# Patient Record
Sex: Female | Born: 1935 | Race: White | Hispanic: No | Marital: Married | Smoking: Never smoker
Health system: Southern US, Community
[De-identification: ages and names within clinical notes are randomized; demographics above are authoritative.]

## PROBLEM LIST (undated history)

## (undated) DIAGNOSIS — F0390 Unspecified dementia without behavioral disturbance: Secondary | ICD-10-CM

## (undated) DIAGNOSIS — I10 Essential (primary) hypertension: Secondary | ICD-10-CM

## (undated) HISTORY — PX: CARDIAC SURGERY: SHX584

## (undated) HISTORY — PX: COLON SURGERY: SHX602

---

## 2015-06-06 ENCOUNTER — Emergency Department (HOSPITAL_COMMUNITY)
Admission: EM | Admit: 2015-06-06 | Discharge: 2015-06-06 | Disposition: A | Discharge status: Home / Self Care | Payer: Medicare Other | Attending: Emergency Medicine | Admitting: Emergency Medicine

## 2015-06-06 ENCOUNTER — Emergency Department (HOSPITAL_COMMUNITY)
Admit: 2015-06-06 | Discharge: 2015-06-06 | Disposition: A | Discharge status: Home / Self Care | Payer: Medicare Other | Attending: Emergency Medicine | Admitting: Emergency Medicine

## 2015-06-06 ENCOUNTER — Emergency Department (HOSPITAL_COMMUNITY): Payer: Medicare Other

## 2015-06-06 ENCOUNTER — Encounter (HOSPITAL_COMMUNITY): Payer: Self-pay | Admitting: *Deleted

## 2015-06-06 DIAGNOSIS — R0789 Other chest pain: Secondary | ICD-10-CM | POA: Diagnosis not present

## 2015-06-06 DIAGNOSIS — F0390 Unspecified dementia without behavioral disturbance: Secondary | ICD-10-CM

## 2015-06-06 DIAGNOSIS — Z79899 Other long term (current) drug therapy: Secondary | ICD-10-CM | POA: Diagnosis not present

## 2015-06-06 DIAGNOSIS — R296 Repeated falls: Secondary | ICD-10-CM | POA: Diagnosis not present

## 2015-06-06 DIAGNOSIS — I10 Essential (primary) hypertension: Secondary | ICD-10-CM | POA: Diagnosis not present

## 2015-06-06 DIAGNOSIS — R2243 Localized swelling, mass and lump, lower limb, bilateral: Secondary | ICD-10-CM | POA: Diagnosis not present

## 2015-06-06 DIAGNOSIS — R0602 Shortness of breath: Secondary | ICD-10-CM | POA: Diagnosis present

## 2015-06-06 DIAGNOSIS — M7989 Other specified soft tissue disorders: Secondary | ICD-10-CM | POA: Diagnosis not present

## 2015-06-06 HISTORY — DX: Essential (primary) hypertension: I10

## 2015-06-06 HISTORY — DX: Unspecified dementia, unspecified severity, without behavioral disturbance, psychotic disturbance, mood disturbance, and anxiety: F03.90

## 2015-06-06 LAB — BRAIN NATRIURETIC PEPTIDE: B NATRIURETIC PEPTIDE 5: 133.9 pg/mL — AB (ref 0.0–100.0)

## 2015-06-06 LAB — CBC
HCT: 39.8 % (ref 36.0–46.0)
Hemoglobin: 13.1 g/dL (ref 12.0–15.0)
MCH: 30.2 pg (ref 26.0–34.0)
MCHC: 32.9 g/dL (ref 30.0–36.0)
MCV: 91.7 fL (ref 78.0–100.0)
PLATELETS: 275 10*3/uL (ref 150–400)
RBC: 4.34 MIL/uL (ref 3.87–5.11)
RDW: 14.7 % (ref 11.5–15.5)
WBC: 5.9 10*3/uL (ref 4.0–10.5)

## 2015-06-06 LAB — BASIC METABOLIC PANEL
ANION GAP: 8 (ref 5–15)
BUN: 18 mg/dL (ref 6–20)
CO2: 23 mmol/L (ref 22–32)
Calcium: 8.7 mg/dL — ABNORMAL LOW (ref 8.9–10.3)
Chloride: 108 mmol/L (ref 101–111)
Creatinine, Ser: 0.85 mg/dL (ref 0.44–1.00)
GFR calc Af Amer: >60 mL/min (ref >60)
GFR, EST NON AFRICAN AMERICAN: 60 mL/min — AB (ref >60)
GLUCOSE: 92 mg/dL (ref 65–99)
POTASSIUM: 3.5 mmol/L (ref 3.5–5.1)
SODIUM: 139 mmol/L (ref 135–145)

## 2015-06-06 LAB — I-STAT TROPONIN, ED
TROPONIN I, POC: 0 ng/mL (ref 0.00–0.08)
Troponin i, poc: 0.01 ng/mL (ref 0.00–0.08)

## 2015-06-06 MED ORDER — ALPRAZOLAM 0.25 MG PO TABS: 0.2500 mg | ORAL_TABLET | Freq: Three times a day (TID) | ORAL | Status: DC | PRN
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
## 2015-06-06 NOTE — ED Notes (Signed)
Code 2837
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
## 2015-06-06 NOTE — Discharge Instructions (Signed)
Dementia Dementia is a general term for problems with brain function. A person with dementia has memory loss and a hard time with at least one other brain function such as thinking, speaking, or problem solving. Dementia can affect social functioning, how you do your job, your mood, or your personality. The changes may be hidden for a long time. The earliest forms of this disease are usually not detected by family or friends. Dementia can be:  Irreversible.  Potentially reversible.  Partially reversible.  Progressive. This means it can get worse over time. CAUSES  Irreversible dementia causes may include:  Degeneration of brain cells (Alzheimer disease or Lewy body dementia).  Multiple small strokes (vascular dementia).  Infection (chronic meningitis or Creutzfeldt-Jakob disease).  Frontotemporal dementia. This affects younger people, age 40 to 70, compared to those who have Alzheimer disease.  Dementia associated with other disorders like Parkinson disease, Huntington disease, or HIV-associated dementia. Potentially or partially reversible dementia causes may include:  Medicines.  Metabolic causes such as excessive alcohol intake, vitamin B12 deficiency, or thyroid disease.  Masses or pressure in the brain such as a tumor, blood clot, or hydrocephalus. SIGNS AND SYMPTOMS  Symptoms are often hard to detect. Family members or coworkers may not notice them early in the disease process. Different people with dementia may have different symptoms. Symptoms can include:  A hard time with memory, especially recent memory. Long-term memory may not be impaired.  Asking the same question multiple times or forgetting something someone just said.  A hard time speaking your thoughts or finding certain words.  A hard time solving problems or performing familiar tasks (such as how to use a telephone).  Sudden changes in mood.  Changes in personality, especially increasing moodiness or  mistrust.  Depression.  A hard time understanding complex ideas that were never a problem in the past. DIAGNOSIS  There are no specific tests for dementia.   Your health care provider may recommend a thorough evaluation. This is because some forms of dementia can be reversible. The evaluation will likely include a physical exam and getting a detailed history from you and a family member. The history often gives the best clues and suggestions for a diagnosis.  Memory testing may be done. A detailed brain function evaluation called neuropsychologic testing may be helpful.  Lab tests and brain imaging (such as a CT scan or MRI scan) are sometimes important.  Sometimes observation and re-evaluation over time is very helpful. TREATMENT  Treatment depends on the cause.   If the problem is a vitamin deficiency, it may be helped or cured with supplements.  For dementias such as Alzheimer disease, medicines are available to stabilize or slow the course of the disease. There are no cures for this type of dementia.  Your health care provider can help direct you to groups, organizations, and other health care providers to help with decisions in the care of you or your loved one. HOME CARE INSTRUCTIONS The care of individuals with dementia is varied and dependent upon the progression of the dementia. The following suggestions are intended for the person living with, or caring for, the person with dementia.  Create a safe environment.  Remove the locks on bathroom doors to prevent the person from accidentally locking himself or herself in.  Use childproof latches on kitchen cabinets and any place where cleaning supplies, chemicals, or alcohol are kept.  Use childproof covers in unused electrical outlets.  Install childproof devices to keep doors and windows   secured.  Remove stove knobs or install safety knobs and an automatic shut-off on the stove.  Lower the temperature on water  heaters.  Label medicines and keep them locked up.  Secure knives, lighters, matches, power tools, and guns, and keep these items out of reach.  Keep the house free from clutter. Remove rugs or anything that might contribute to a fall.  Remove objects that might break and hurt the person.  Make sure lighting is good, both inside and outside.  Install grab rails as needed.  Use a monitoring device to alert you to falls or other needs for help.  Reduce confusion.  Keep familiar objects and people around.  Use night lights or dim lights at night.  Label items or areas.  Use reminders, notes, or directions for daily activities or tasks.  Keep a simple, consistent routine for waking, meals, bathing, dressing, and bedtime.  Create a calm, quiet environment.  Place large clocks and calendars prominently.  Display emergency numbers and home address near all telephones.  Use cues to establish different times of the day. An example is to open curtains to let the natural light in during the day.   Use effective communication.  Choose simple words and short sentences.  Use a gentle, calm tone of voice.  Be careful not to interrupt.  If the person is struggling to find a word or communicate a thought, try to provide the word or thought.  Ask one question at a time. Allow the person ample time to answer questions. Repeat the question again if the person does not respond.  Reduce nighttime restlessness.  Provide a comfortable bed.  Have a consistent nighttime routine.  Ensure a regular walking or physical activity schedule. Involve the person in daily activities as much as possible.  Limit napping during the day.  Limit caffeine.  Attend social events that stimulate rather than overwhelm the senses.  Encourage good nutrition and hydration.  Reduce distractions during meal times and snacks.  Avoid foods that are too hot or too cold.  Monitor chewing and swallowing  ability.  Continue with routine vision, hearing, dental, and medical screenings.  Give medicines only as directed by the health care provider.  Monitor driving abilities. Do not allow the person to drive when safe driving is no longer possible.  Register with an identification program which could provide location assistance in the event of a missing person situation. SEEK MEDICAL CARE IF:   New behavioral problems start such as moodiness, aggressiveness, or seeing things that are not there (hallucinations).  Any new problem with brain function happens. This includes problems with balance, speech, or falling a lot.  Problems with swallowing develop.  Any symptoms of other illness happen. Small changes or worsening in any aspect of brain function can be a sign that the illness is getting worse. It can also be a sign of another medical illness such as infection. Seeing a health care provider right away is important. SEEK IMMEDIATE MEDICAL CARE IF:   A fever develops.  New or worsened confusion develops.  New or worsened sleepiness develops.  Staying awake becomes hard to do. Document Released: 05/28/2001 Document Revised: 04/18/2014 Document Reviewed: 04/29/2011 [facility] Patient Information 2015 [place], [place]. This information is not intended to replace advice given to you by your health care provider. Make sure you discuss any questions you have with your health care provider.    Chest Pain (Nonspecific) It is often hard to give a diagnosis for the  cause of chest pain. There is always a chance that your pain could be related to something serious, such as a heart attack or a blood clot in the lungs. You need to follow up with your doctor. HOME CARE  If antibiotic medicine was given, take it as directed by your doctor. Finish the medicine even if you start to feel better.  For the next few days, avoid activities that bring on chest pain. Continue physical activities as told by  your doctor.  Do not use any tobacco products. This includes cigarettes, chewing tobacco, and e-cigarettes.  Avoid drinking alcohol.  Only take medicine as told by your doctor.  Follow your doctor's suggestions for more testing if your chest pain does not go away.  Keep all doctor visits you made. GET HELP IF:  Your chest pain does not go away, even after treatment.  You have a rash with blisters on your chest.  You have a fever. GET HELP RIGHT AWAY IF:   You have more pain or pain that spreads to your arm, neck, jaw, back, or belly (abdomen).  You have shortness of breath.  You cough more than usual or cough up blood.  You have very bad back or belly pain.  You feel sick to your stomach (nauseous) or throw up (vomit).  You have very bad weakness.  You pass out (faint).  You have chills. This is an emergency. Do not wait to see if the problems will go away. Call your local emergency services (911 in [place] Do not drive yourself to the hospital. MAKE SURE YOU:   Understand these instructions.  Will watch your condition.  Will get help right away if you are not doing well or get worse. Document Released: 05/20/2008 Document Revised: 12/07/2013 Document Reviewed: 05/20/2008 [facility] Patient Information 2015 [place], [place]. This information is not intended to replace advice given to you by your health care provider. Make sure you discuss any questions you have with your health care provider.

## 2015-06-06 NOTE — ED Notes (Signed)
PT at the bedside to evaluate.

## 2015-06-06 NOTE — ED Notes (Signed)
Home health at bedside.
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
## 2015-06-06 NOTE — ED Notes (Signed)
Case worker at bedside.

## 2015-06-06 NOTE — Progress Notes (Signed)
VASCULAR LAB PRELIMINARY  PRELIMINARY  PRELIMINARY  PRELIMINARY  Left lower extremity venous duplex completed.    Preliminary report:  Left:  No evidence of DVT, superficial thrombosis, or Baker's cyst.  Ann-Marie Kluge, RVT 06/06/2015, 11:19 AM
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
## 2015-06-06 NOTE — ED Notes (Signed)
Dahlia Client Child psychotherapist at bedside with pt and family

## 2015-06-06 NOTE — ED Notes (Signed)
Pt has dementia, family reports pt having increase in agitation, leg swelling and cp/sob.

## 2015-06-09 ENCOUNTER — Emergency Department (HOSPITAL_COMMUNITY): Payer: Medicare Other

## 2015-06-09 ENCOUNTER — Emergency Department (HOSPITAL_COMMUNITY)
Admission: EM | Admit: 2015-06-09 | Discharge: 2015-06-09 | Disposition: A | Discharge status: Home / Self Care | Payer: Medicare Other | Attending: Emergency Medicine | Admitting: Emergency Medicine

## 2015-06-09 ENCOUNTER — Encounter (HOSPITAL_COMMUNITY): Payer: Self-pay | Admitting: *Deleted

## 2015-06-09 DIAGNOSIS — Z79899 Other long term (current) drug therapy: Secondary | ICD-10-CM | POA: Diagnosis not present

## 2015-06-09 DIAGNOSIS — Y998 Other external cause status: Secondary | ICD-10-CM | POA: Diagnosis not present

## 2015-06-09 DIAGNOSIS — W1839XA Other fall on same level, initial encounter: Secondary | ICD-10-CM | POA: Insufficient documentation

## 2015-06-09 DIAGNOSIS — I10 Essential (primary) hypertension: Secondary | ICD-10-CM | POA: Diagnosis not present

## 2015-06-09 DIAGNOSIS — W19XXXA Unspecified fall, initial encounter: Secondary | ICD-10-CM

## 2015-06-09 DIAGNOSIS — Z008 Encounter for other general examination: Secondary | ICD-10-CM | POA: Diagnosis present

## 2015-06-09 DIAGNOSIS — Z043 Encounter for examination and observation following other accident: Secondary | ICD-10-CM | POA: Diagnosis not present

## 2015-06-09 DIAGNOSIS — Y929 Unspecified place or not applicable: Secondary | ICD-10-CM | POA: Diagnosis not present

## 2015-06-09 DIAGNOSIS — Y939 Activity, unspecified: Secondary | ICD-10-CM | POA: Diagnosis not present

## 2015-06-09 DIAGNOSIS — F0390 Unspecified dementia without behavioral disturbance: Secondary | ICD-10-CM | POA: Insufficient documentation

## 2015-06-09 DIAGNOSIS — F419 Anxiety disorder, unspecified: Secondary | ICD-10-CM | POA: Insufficient documentation

## 2015-06-09 LAB — COMPREHENSIVE METABOLIC PANEL
ALT: 15 U/L (ref 14–54)
ANION GAP: 8 (ref 5–15)
AST: 19 U/L (ref 15–41)
Albumin: 3.4 g/dL — ABNORMAL LOW (ref 3.5–5.0)
Alkaline Phosphatase: 106 U/L (ref 38–126)
BILIRUBIN TOTAL: 0.7 mg/dL (ref 0.3–1.2)
BUN: 16 mg/dL (ref 6–20)
CALCIUM: 9 mg/dL (ref 8.9–10.3)
CHLORIDE: 106 mmol/L (ref 101–111)
CO2: 25 mmol/L (ref 22–32)
Creatinine, Ser: 0.75 mg/dL (ref 0.44–1.00)
GFR calc non Af Amer: >60 mL/min (ref >60)
GLUCOSE: 96 mg/dL (ref 65–99)
Potassium: 3.4 mmol/L — ABNORMAL LOW (ref 3.5–5.1)
Sodium: 139 mmol/L (ref 135–145)
Total Protein: 6.9 g/dL (ref 6.5–8.1)

## 2015-06-09 LAB — URINALYSIS, ROUTINE W REFLEX MICROSCOPIC
BILIRUBIN URINE: NEGATIVE
GLUCOSE, UA: NEGATIVE mg/dL
Hgb urine dipstick: NEGATIVE
KETONES UR: NEGATIVE mg/dL
LEUKOCYTES UA: NEGATIVE
Nitrite: NEGATIVE
PROTEIN: NEGATIVE mg/dL
Specific Gravity, Urine: 1.01 (ref 1.005–1.030)
Urobilinogen, UA: 0.2 mg/dL (ref 0.0–1.0)
pH: 5.5 (ref 5.0–8.0)

## 2015-06-09 LAB — CBC WITH DIFFERENTIAL/PLATELET
BASOS PCT: 1 % (ref 0–1)
Basophils Absolute: 0 10*3/uL (ref 0.0–0.1)
EOS ABS: 0.1 10*3/uL (ref 0.0–0.7)
Eosinophils Relative: 1 % (ref 0–5)
HCT: 39.3 % (ref 36.0–46.0)
Hemoglobin: 13.1 g/dL (ref 12.0–15.0)
Lymphocytes Relative: 34 % (ref 12–46)
Lymphs Abs: 2.1 10*3/uL (ref 0.7–4.0)
MCH: 30.3 pg (ref 26.0–34.0)
MCHC: 33.3 g/dL (ref 30.0–36.0)
MCV: 90.8 fL (ref 78.0–100.0)
MONO ABS: 0.4 10*3/uL (ref 0.1–1.0)
Monocytes Relative: 7 % (ref 3–12)
NEUTROS PCT: 57 % (ref 43–77)
Neutro Abs: 3.6 10*3/uL (ref 1.7–7.7)
Platelets: 311 10*3/uL (ref 150–400)
RBC: 4.33 MIL/uL (ref 3.87–5.11)
RDW: 14.8 % (ref 11.5–15.5)
WBC: 6.2 10*3/uL (ref 4.0–10.5)

## 2015-06-09 LAB — CBG MONITORING, ED: Glucose-Capillary: 107 mg/dL — ABNORMAL HIGH (ref 65–99)

## 2015-06-09 NOTE — ED Notes (Signed)
Pt was sent here from her PCP for a psych evaluation. Son and friend at bedside her dementia has progressively worsen. Pt lives with older brother who is unable to care for the patient. Son reports auditory hallucinations. Pt has recently been wondering away from her house. Pt denies pain or any other symptoms.

## 2015-06-09 NOTE — ED Notes (Signed)
Pt left with family before being discharged, family asked to wait with patient and they stated they did not want to

## 2015-06-09 NOTE — ED Notes (Signed)
Son reports recently fall a couple of days ago.
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
## 2016-08-29 IMAGING — CR DG CHEST 2V
2 series · 2 of 2 positions shown · non-contrast
Comparison: 09/06/2009

CLINICAL DATA: Short of breath and leg swelling

EXAM:
CHEST  2 VIEW

[chest lat]
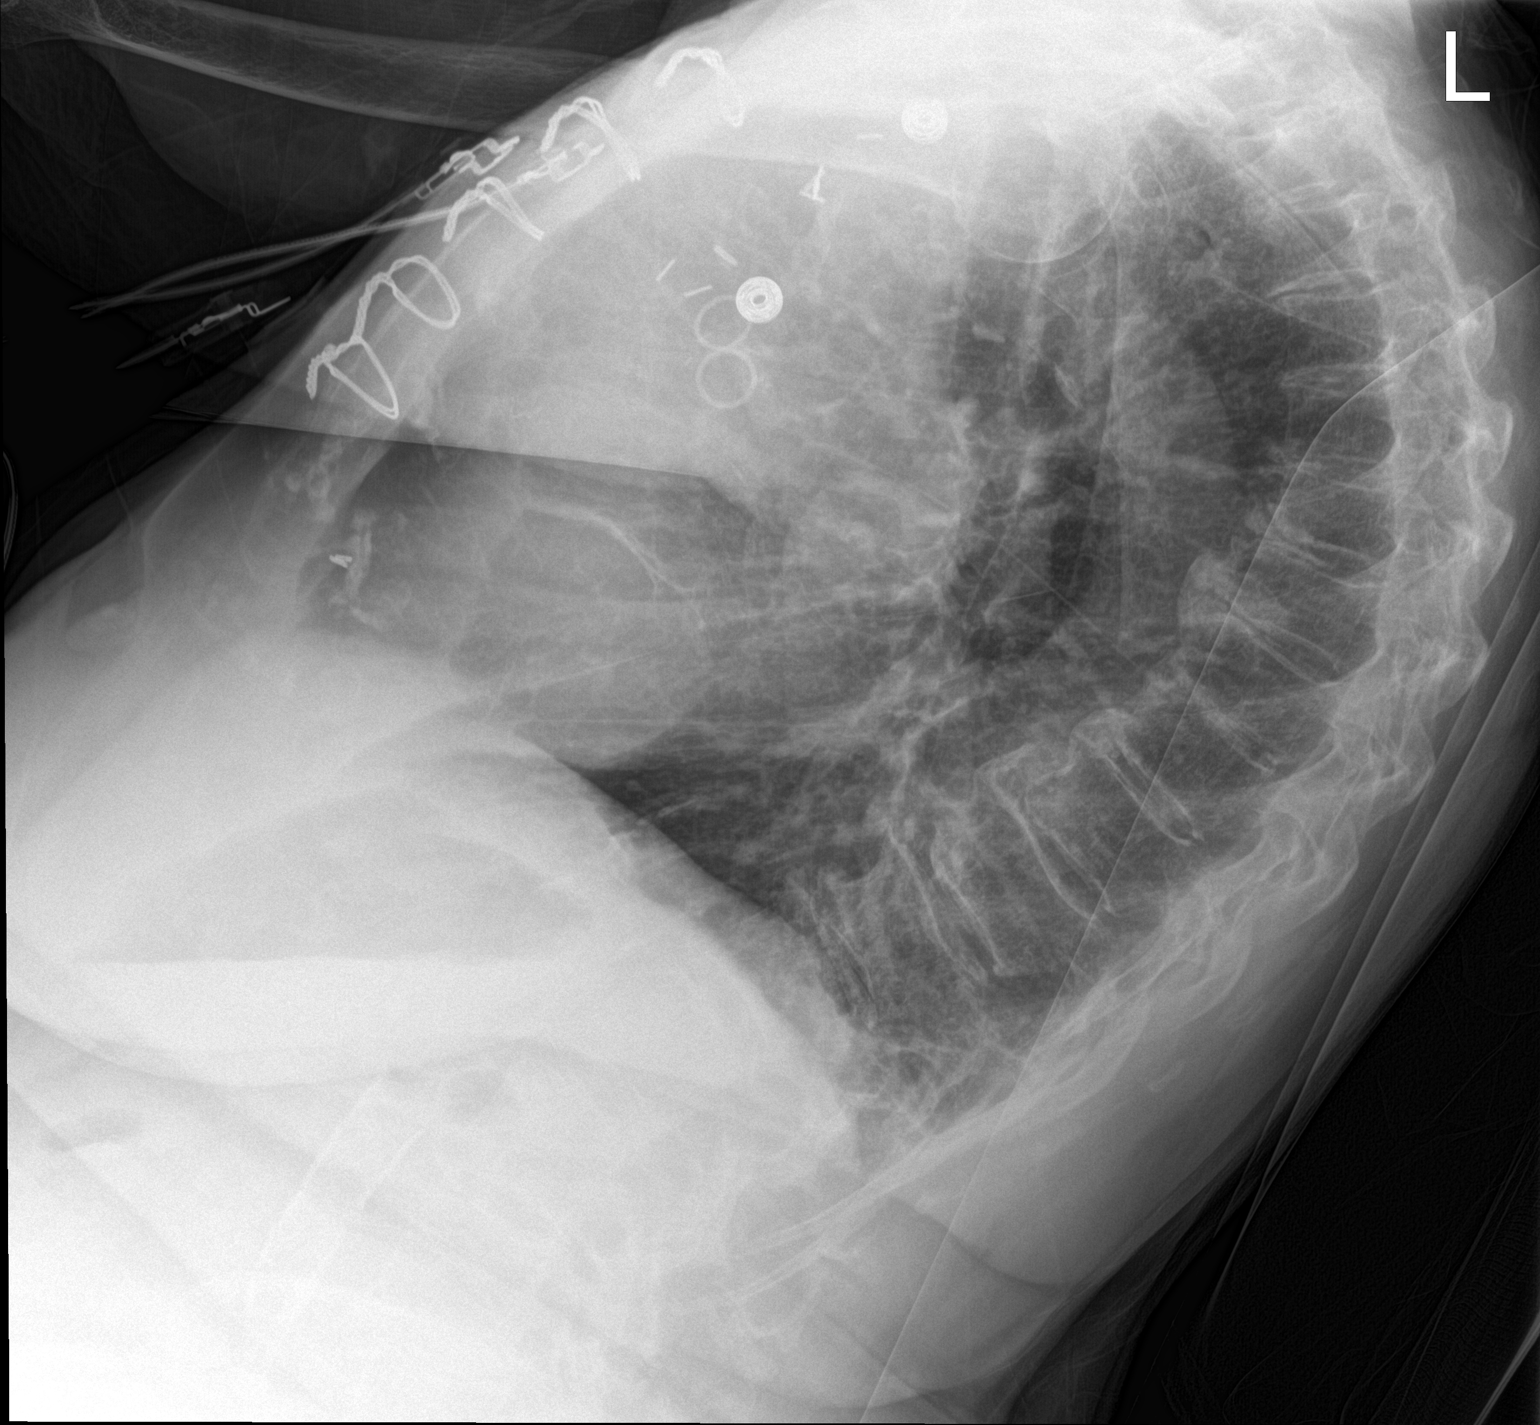

[chest ap]
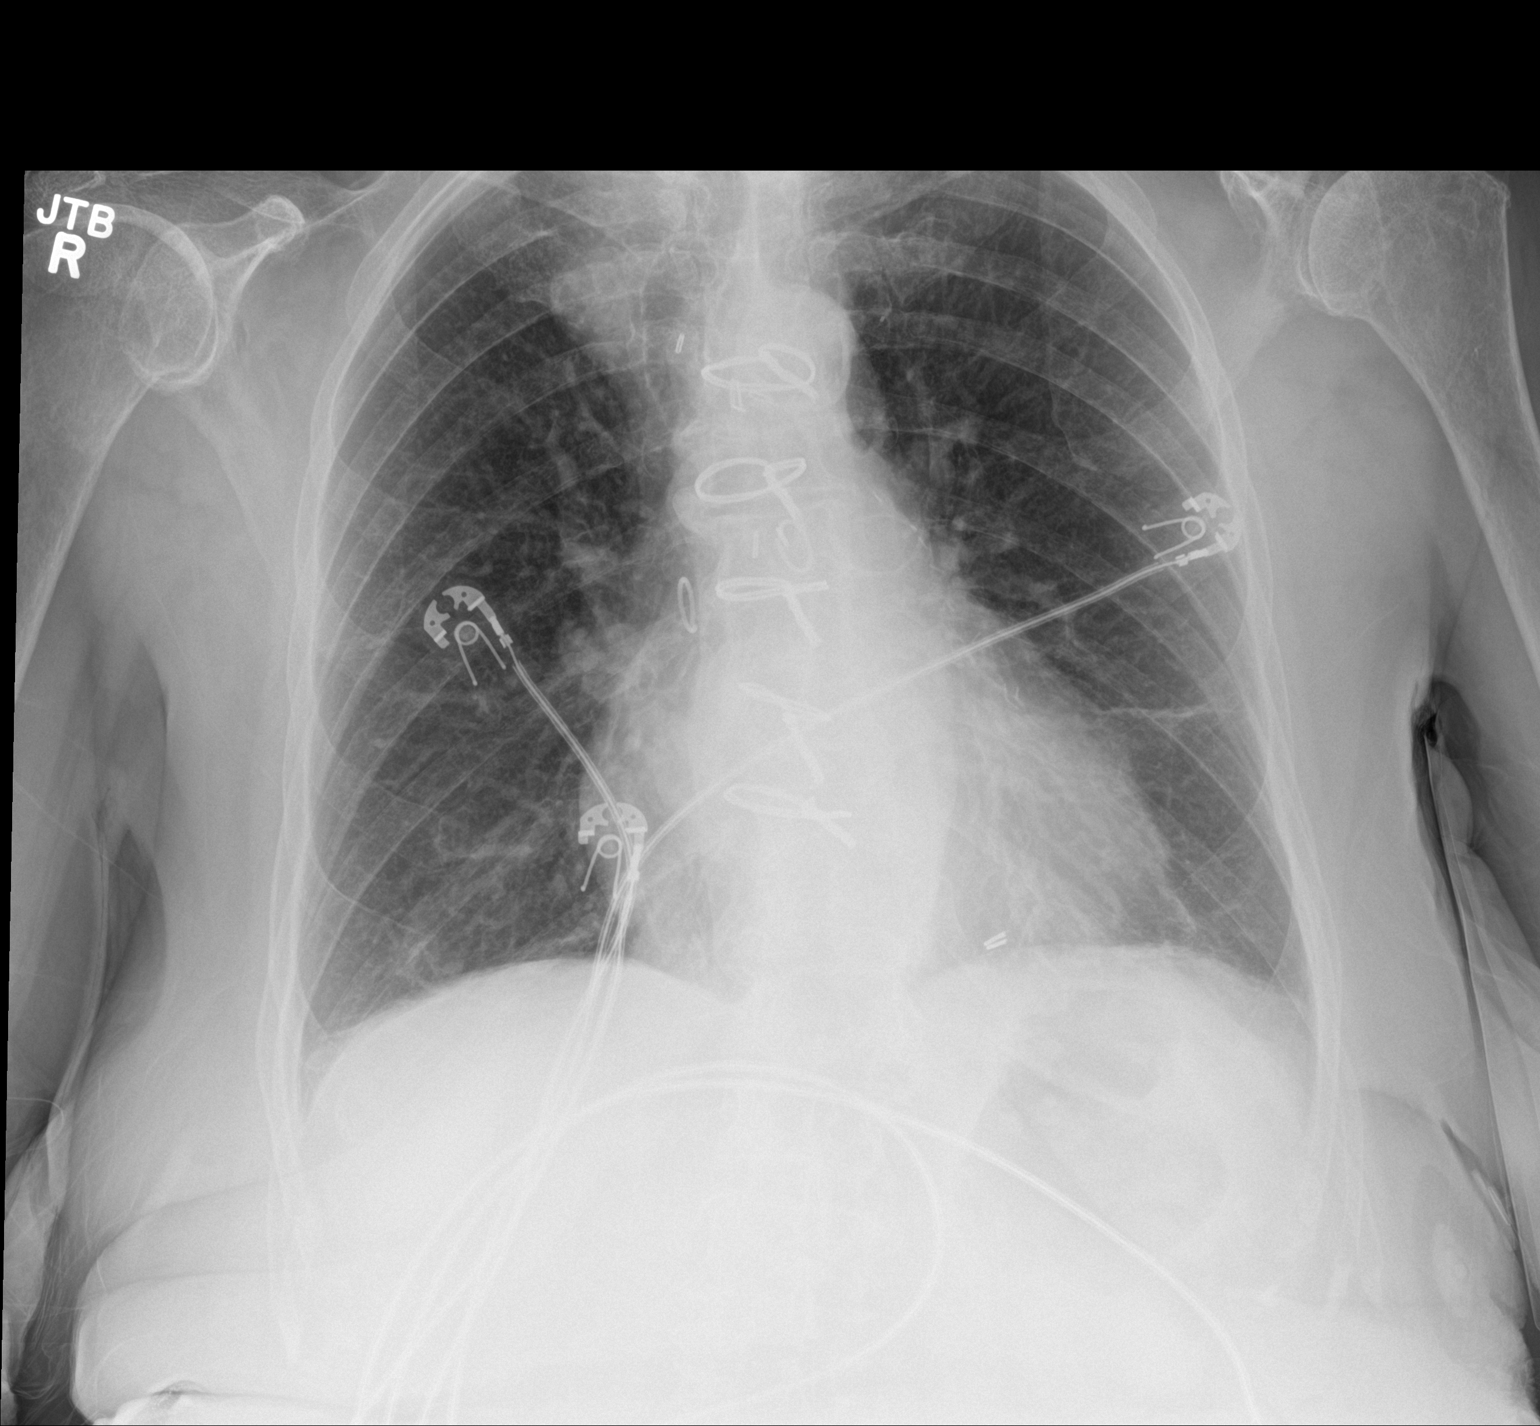

[2 of 2 positions shown; findings below may reference images not displayed]

FINDINGS: Prior CABG. Heart size upper normal. Negative for heart failure. No
infiltrate or effusion. Mild scarring in the lingula is unchanged.
IMPRESSION: No active cardiopulmonary disease.

## 2021-12-02 ENCOUNTER — Encounter (HOSPITAL_COMMUNITY): Payer: Self-pay | Admitting: *Deleted

## 2021-12-02 ENCOUNTER — Other Ambulatory Visit: Payer: Self-pay

## 2021-12-02 ENCOUNTER — Emergency Department (HOSPITAL_COMMUNITY)
Admission: EM | Admit: 2021-12-02 | Discharge: 2021-12-03 | Disposition: A | Discharge status: Home / Self Care | Payer: Medicare HMO | Attending: Emergency Medicine | Admitting: Emergency Medicine

## 2021-12-02 ENCOUNTER — Emergency Department (HOSPITAL_COMMUNITY): Payer: Medicare HMO

## 2021-12-02 DIAGNOSIS — Z79899 Other long term (current) drug therapy: Secondary | ICD-10-CM | POA: Diagnosis not present

## 2021-12-02 DIAGNOSIS — R002 Palpitations: Secondary | ICD-10-CM | POA: Insufficient documentation

## 2021-12-02 DIAGNOSIS — Z20822 Contact with and (suspected) exposure to covid-19: Secondary | ICD-10-CM | POA: Diagnosis not present

## 2021-12-02 DIAGNOSIS — Y92410 Unspecified street and highway as the place of occurrence of the external cause: Secondary | ICD-10-CM | POA: Diagnosis not present

## 2021-12-02 DIAGNOSIS — F0390 Unspecified dementia without behavioral disturbance: Secondary | ICD-10-CM | POA: Insufficient documentation

## 2021-12-02 DIAGNOSIS — S12100A Unspecified displaced fracture of second cervical vertebra, initial encounter for closed fracture: Secondary | ICD-10-CM | POA: Insufficient documentation

## 2021-12-02 DIAGNOSIS — I10 Essential (primary) hypertension: Secondary | ICD-10-CM | POA: Insufficient documentation

## 2021-12-02 DIAGNOSIS — S0990XA Unspecified injury of head, initial encounter: Secondary | ICD-10-CM | POA: Diagnosis not present

## 2021-12-02 DIAGNOSIS — S14109A Unspecified injury at unspecified level of cervical spinal cord, initial encounter: Secondary | ICD-10-CM | POA: Diagnosis present

## 2021-12-02 DIAGNOSIS — K5730 Diverticulosis of large intestine without perforation or abscess without bleeding: Secondary | ICD-10-CM | POA: Insufficient documentation

## 2021-12-02 LAB — CBC WITH DIFFERENTIAL/PLATELET
Abs Immature Granulocytes: 0.05 10*3/uL (ref 0.00–0.07)
Basophils Absolute: 0 10*3/uL (ref 0.0–0.1)
Basophils Relative: 0 %
Eosinophils Absolute: 0 10*3/uL (ref 0.0–0.5)
Eosinophils Relative: 0 %
HCT: 45.3 % (ref 36.0–46.0)
Hemoglobin: 14.3 g/dL (ref 12.0–15.0)
Immature Granulocytes: 0 %
Lymphocytes Relative: 13 %
Lymphs Abs: 1.8 10*3/uL (ref 0.7–4.0)
MCH: 29.9 pg (ref 26.0–34.0)
MCHC: 31.6 g/dL (ref 30.0–36.0)
MCV: 94.8 fL (ref 80.0–100.0)
Monocytes Absolute: 0.8 10*3/uL (ref 0.1–1.0)
Monocytes Relative: 6 %
Neutro Abs: 11.4 10*3/uL — ABNORMAL HIGH (ref 1.7–7.7)
Neutrophils Relative %: 81 %
Platelets: 281 10*3/uL (ref 150–400)
RBC: 4.78 MIL/uL (ref 3.87–5.11)
RDW: 14.1 % (ref 11.5–15.5)
WBC: 14 10*3/uL — ABNORMAL HIGH (ref 4.0–10.5)
nRBC: 0 % (ref 0.0–0.2)

## 2021-12-02 LAB — SAMPLE TO BLOOD BANK

## 2021-12-02 MED ORDER — FENTANYL CITRATE PF 50 MCG/ML IJ SOSY: 50.0000 ug | PREFILLED_SYRINGE | INTRAMUSCULAR | Status: DC | PRN

## 2021-12-02 MED ORDER — FENTANYL CITRATE PF 50 MCG/ML IJ SOSY
50.0000 ug | PREFILLED_SYRINGE | Freq: Once | INTRAMUSCULAR | Status: AC
  Administered 2021-12-03: 50 ug via INTRAVENOUS
  Filled 2021-12-02: qty 1

## 2021-12-02 MED ORDER — SODIUM CHLORIDE 0.9 % IV BOLUS
125.0000 mL | Freq: Once | INTRAVENOUS | Status: AC
  Administered 2021-12-03: 125 mL via INTRAVENOUS

## 2021-12-02 NOTE — ED Notes (Signed)
Patient transported to CT 
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
## 2021-12-02 NOTE — ED Notes (Signed)
Charge nurse Pattricia Boss informed pt's need to be moved to room.
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
## 2021-12-03 ENCOUNTER — Emergency Department (HOSPITAL_COMMUNITY): Payer: Medicare HMO

## 2021-12-03 LAB — BASIC METABOLIC PANEL
Anion gap: 11 (ref 5–15)
BUN: 22 mg/dL (ref 8–23)
CO2: 21 mmol/L — ABNORMAL LOW (ref 22–32)
Calcium: 9.3 mg/dL (ref 8.9–10.3)
Chloride: 105 mmol/L (ref 98–111)
Creatinine, Ser: 0.86 mg/dL (ref 0.44–1.00)
GFR, Estimated: >60 mL/min (ref >60)
Glucose, Bld: 128 mg/dL — ABNORMAL HIGH (ref 70–99)
Potassium: 4.1 mmol/L (ref 3.5–5.1)
Sodium: 137 mmol/L (ref 135–145)

## 2021-12-03 LAB — I-STAT CHEM 8, ED
BUN: 36 mg/dL — ABNORMAL HIGH (ref 8–23)
Calcium, Ion: 1.09 mmol/L — ABNORMAL LOW (ref 1.15–1.40)
Chloride: 106 mmol/L (ref 98–111)
Creatinine, Ser: 0.8 mg/dL (ref 0.44–1.00)
Glucose, Bld: 120 mg/dL — ABNORMAL HIGH (ref 70–99)
HCT: 46 % (ref 36.0–46.0)
Hemoglobin: 15.6 g/dL — ABNORMAL HIGH (ref 12.0–15.0)
Potassium: 4.8 mmol/L (ref 3.5–5.1)
Sodium: 136 mmol/L (ref 135–145)
TCO2: 26 mmol/L (ref 22–32)

## 2021-12-03 LAB — HEPATIC FUNCTION PANEL
ALT: 20 U/L (ref 0–44)
AST: 23 U/L (ref 15–41)
Albumin: 3.3 g/dL — ABNORMAL LOW (ref 3.5–5.0)
Alkaline Phosphatase: 103 U/L (ref 38–126)
Bilirubin, Direct: 0.1 mg/dL (ref 0.0–0.2)
Indirect Bilirubin: 0.6 mg/dL (ref 0.3–0.9)
Total Bilirubin: 0.7 mg/dL (ref 0.3–1.2)
Total Protein: 7.5 g/dL (ref 6.5–8.1)

## 2021-12-03 LAB — RESP PANEL BY RT-PCR (FLU A&B, COVID) ARPGX2
Influenza A by PCR: NEGATIVE
Influenza B by PCR: NEGATIVE
SARS Coronavirus 2 by RT PCR: NEGATIVE

## 2021-12-03 LAB — PROTIME-INR
INR: 1.1 (ref 0.8–1.2)
Prothrombin Time: 14.2 seconds (ref 11.4–15.2)

## 2021-12-03 LAB — LACTIC ACID, PLASMA: Lactic Acid, Venous: 1.5 mmol/L (ref 0.5–1.9)

## 2021-12-03 MED ORDER — ONDANSETRON HCL 4 MG/2ML IJ SOLN: 4.0000 mg | Freq: Once | INTRAMUSCULAR | Status: AC

## 2021-12-03 MED ORDER — ONDANSETRON HCL 4 MG/2ML IJ SOLN
INTRAMUSCULAR | Status: AC
  Administered 2021-12-03: 4 mg via INTRAVENOUS
  Filled 2021-12-03: qty 2

## 2021-12-03 MED ORDER — IOHEXOL 300 MG/ML  SOLN
80.0000 mL | Freq: Once | INTRAMUSCULAR | Status: AC | PRN
  Administered 2021-12-03: 01:00:00 80 mL via INTRAVENOUS

## 2021-12-03 NOTE — ED Notes (Signed)
Discharge instructions discussed with caregiver son at bedside. He verbalized understanding of importance of keeping c collar in place. Discussed follow up with neurosurgery ASAP.   Upon transferring pt to wheelchair. Pt has several back to back episodes of incontinence. Son states pt is on lasix and has urinary frequency. However is usually able to go to bathroom independently. Concerns discussed with PA Rob. Informed of occurrence. Pt sensation intact and was able to get up from bed to wheelchair with assistance. No further orders from PA. PA cleared pt for discharge. Plan discussed with Son at bedside. If incontinence continues pt to return to the ED. Pt to continue to follow up with neurosurgery.
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
## 2021-12-03 NOTE — Discharge Instructions (Signed)
You must wear the cervical spine collar until you are cleared by the neurosurgeon.  Please call the office today to find out about an appointment.  You can take Tylenol for pain.  Return to the emergency department for changes in mental status.

## 2021-12-11 ENCOUNTER — Ambulatory Visit (INDEPENDENT_AMBULATORY_CARE_PROVIDER_SITE_OTHER): Payer: Medicare HMO | Admitting: Family Medicine

## 2021-12-11 VITALS — BP 117/83 | Ht 62.0 in | Wt 150.0 lb

## 2021-12-11 DIAGNOSIS — S12112A Nondisplaced Type II dens fracture, initial encounter for closed fracture: Secondary | ICD-10-CM

## 2021-12-11 MED ORDER — KETOROLAC TROMETHAMINE 30 MG/ML IJ SOLN
30.0000 mg | Freq: Once | INTRAMUSCULAR | Status: AC
  Administered 2021-12-11: 12:00:00 30 mg via INTRAMUSCULAR
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
## 2021-12-11 NOTE — Patient Instructions (Signed)
Nice to meet you Please continue the neck brace We'll refer to neurosurgery   Please send me a message in MyChart with any questions or updates.  Please see me back as needed.   --Dr. Jordan Likes

## 2021-12-12 ENCOUNTER — Ambulatory Visit: Payer: Medicare Other | Admitting: Family Medicine

## 2022-05-25 ENCOUNTER — Encounter (HOSPITAL_COMMUNITY): Payer: Self-pay

## 2022-05-25 ENCOUNTER — Inpatient Hospital Stay (HOSPITAL_COMMUNITY)
Admission: EM | Admit: 2022-05-25 | Discharge: 2022-05-30 | DRG: 689 | Disposition: A | Discharge status: Skilled Nursing Facility | Payer: Medicare HMO | Attending: Internal Medicine | Admitting: Internal Medicine

## 2022-05-25 ENCOUNTER — Other Ambulatory Visit: Payer: Self-pay

## 2022-05-25 DIAGNOSIS — Z880 Allergy status to penicillin: Secondary | ICD-10-CM

## 2022-05-25 DIAGNOSIS — E785 Hyperlipidemia, unspecified: Secondary | ICD-10-CM | POA: Diagnosis present

## 2022-05-25 DIAGNOSIS — G9341 Metabolic encephalopathy: Secondary | ICD-10-CM | POA: Diagnosis present

## 2022-05-25 DIAGNOSIS — F0390 Unspecified dementia without behavioral disturbance: Secondary | ICD-10-CM | POA: Diagnosis present

## 2022-05-25 DIAGNOSIS — R410 Disorientation, unspecified: Secondary | ICD-10-CM | POA: Diagnosis not present

## 2022-05-25 DIAGNOSIS — N3000 Acute cystitis without hematuria: Principal | ICD-10-CM | POA: Diagnosis present

## 2022-05-25 DIAGNOSIS — R600 Localized edema: Secondary | ICD-10-CM

## 2022-05-25 DIAGNOSIS — Z888 Allergy status to other drugs, medicaments and biological substances status: Secondary | ICD-10-CM

## 2022-05-25 DIAGNOSIS — N390 Urinary tract infection, site not specified: Secondary | ICD-10-CM | POA: Diagnosis present

## 2022-05-25 DIAGNOSIS — I5032 Chronic diastolic (congestive) heart failure: Secondary | ICD-10-CM | POA: Diagnosis present

## 2022-05-25 DIAGNOSIS — Z881 Allergy status to other antibiotic agents status: Secondary | ICD-10-CM

## 2022-05-25 DIAGNOSIS — I10 Essential (primary) hypertension: Secondary | ICD-10-CM

## 2022-05-25 DIAGNOSIS — I110 Hypertensive heart disease with heart failure: Secondary | ICD-10-CM | POA: Diagnosis present

## 2022-05-25 DIAGNOSIS — Z885 Allergy status to narcotic agent status: Secondary | ICD-10-CM

## 2022-05-25 DIAGNOSIS — Z79899 Other long term (current) drug therapy: Secondary | ICD-10-CM

## 2022-05-25 DIAGNOSIS — Z882 Allergy status to sulfonamides status: Secondary | ICD-10-CM

## 2022-05-25 DIAGNOSIS — F419 Anxiety disorder, unspecified: Secondary | ICD-10-CM | POA: Diagnosis present

## 2022-05-25 DIAGNOSIS — E538 Deficiency of other specified B group vitamins: Secondary | ICD-10-CM | POA: Diagnosis present

## 2022-05-25 LAB — CBC WITH DIFFERENTIAL/PLATELET
Abs Immature Granulocytes: 0.01 10*3/uL (ref 0.00–0.07)
Basophils Absolute: 0.1 10*3/uL (ref 0.0–0.1)
Basophils Relative: 1 %
Eosinophils Absolute: 0.1 10*3/uL (ref 0.0–0.5)
Eosinophils Relative: 2 %
HCT: 45.1 % (ref 36.0–46.0)
Hemoglobin: 14.2 g/dL (ref 12.0–15.0)
Immature Granulocytes: 0 %
Lymphocytes Relative: 36 %
Lymphs Abs: 2.3 10*3/uL (ref 0.7–4.0)
MCH: 28.9 pg (ref 26.0–34.0)
MCHC: 31.5 g/dL (ref 30.0–36.0)
MCV: 91.7 fL (ref 80.0–100.0)
Monocytes Absolute: 0.5 10*3/uL (ref 0.1–1.0)
Monocytes Relative: 8 %
Neutro Abs: 3.4 10*3/uL (ref 1.7–7.7)
Neutrophils Relative %: 53 %
Platelets: 282 10*3/uL (ref 150–400)
RBC: 4.92 MIL/uL (ref 3.87–5.11)
RDW: 15.2 % (ref 11.5–15.5)
WBC: 6.4 10*3/uL (ref 4.0–10.5)
nRBC: 0 % (ref 0.0–0.2)

## 2022-05-25 LAB — URINALYSIS, ROUTINE W REFLEX MICROSCOPIC
Bilirubin Urine: NEGATIVE
Glucose, UA: NEGATIVE mg/dL
Hgb urine dipstick: NEGATIVE
Ketones, ur: NEGATIVE mg/dL
Nitrite: NEGATIVE
Protein, ur: NEGATIVE mg/dL
Specific Gravity, Urine: 1.018 (ref 1.005–1.030)
pH: 5 (ref 5.0–8.0)

## 2022-05-25 LAB — COMPREHENSIVE METABOLIC PANEL
ALT: 14 U/L (ref 0–44)
AST: 17 U/L (ref 15–41)
Albumin: 3.6 g/dL (ref 3.5–5.0)
Alkaline Phosphatase: 130 U/L — ABNORMAL HIGH (ref 38–126)
Anion gap: 10 (ref 5–15)
BUN: 26 mg/dL — ABNORMAL HIGH (ref 8–23)
CO2: 22 mmol/L (ref 22–32)
Calcium: 9.3 mg/dL (ref 8.9–10.3)
Chloride: 102 mmol/L (ref 98–111)
Creatinine, Ser: 0.94 mg/dL (ref 0.44–1.00)
GFR, Estimated: 56 mL/min — ABNORMAL LOW (ref >60)
Glucose, Bld: 98 mg/dL (ref 70–99)
Potassium: 4 mmol/L (ref 3.5–5.1)
Sodium: 134 mmol/L — ABNORMAL LOW (ref 135–145)
Total Bilirubin: 0.6 mg/dL (ref 0.3–1.2)
Total Protein: 7.1 g/dL (ref 6.5–8.1)

## 2022-05-25 LAB — BRAIN NATRIURETIC PEPTIDE: B Natriuretic Peptide: 90.8 pg/mL (ref 0.0–100.0)

## 2022-05-25 MED ORDER — NITROFURANTOIN MONOHYD MACRO 100 MG PO CAPS: 100.0000 mg | ORAL_CAPSULE | Freq: Once | ORAL | Status: DC

## 2022-05-25 NOTE — ED Triage Notes (Addendum)
Pt to ED via wheelchair by hospital [facility] and [facility].  Pt's caregiver, Territa Mori, has been admitted to [phone].  Per [facility], the staff upstairs are unable to take care of patient and patient does not have any other family to pick her up.  Per [facility], social work instructed them to bring pt to ED.  Per [facility], caregiver wants pt to be checked into ED.  Pt refusing to be checked in, states she does not want to be here. Pt denies any pain.  Pt has swelling in feet and ankles, states she has taken her medications for this.  Pt A&Ox3, disoriented to time, NAD noted.

## 2022-05-25 NOTE — ED Provider Triage Note (Signed)
Emergency Medicine Provider Triage Evaluation Note  Shari Scott , a 88 y.o. female  was evaluated in triage.  Pt complains of not having anyone to take care of her.  Patient's caregiver was admitted to [facility] after having emergency surgery.  She had been staying upstairs with them, now patient has no way of getting picked up to go home.  Patient was brought down by [facility] in order to be admitted, and now also saying that "some of her feet look swollen".  Patient is states "I do not want to stay here ".  Review of Systems  Positive: Leg swelling Negative: Fever, sob, cp  Physical Exam  BP (!) 164/73 (BP Location: Right Arm)   Pulse 80   Temp 97.9 F (36.6 C) (Oral)   Resp 18   Ht 5\' 2"  (1.575 m)   Wt 59 kg   SpO2 97%   BMI 23.78 kg/m  Gen:   Awake, no distress   Resp:  Normal effort  MSK:   Moves extremities without difficulty  Other:    Medical Decision Making  Medically screening exam initiated at 5:25 PM.  Appropriate orders placed.  Shari Scott was informed that the remainder of the evaluation will be completed by another provider, this initial triage assessment does not replace that evaluation, and the importance of remaining in the ED until their evaluation is complete.  Patient here looking for may be placing?  Patient probably will need social work involvement.  She is hemodynamically stable.   Shari Fitting, PA-C 05/25/22 1727

## 2022-05-26 ENCOUNTER — Emergency Department (HOSPITAL_COMMUNITY): Payer: Medicare HMO

## 2022-05-26 ENCOUNTER — Encounter (HOSPITAL_COMMUNITY): Payer: Self-pay | Admitting: Emergency Medicine

## 2022-05-26 ENCOUNTER — Observation Stay (HOSPITAL_COMMUNITY): Payer: Medicare HMO

## 2022-05-26 DIAGNOSIS — N3000 Acute cystitis without hematuria: Secondary | ICD-10-CM

## 2022-05-26 DIAGNOSIS — M7989 Other specified soft tissue disorders: Secondary | ICD-10-CM | POA: Diagnosis not present

## 2022-05-26 DIAGNOSIS — Z79899 Other long term (current) drug therapy: Secondary | ICD-10-CM | POA: Diagnosis not present

## 2022-05-26 DIAGNOSIS — G9341 Metabolic encephalopathy: Secondary | ICD-10-CM | POA: Diagnosis present

## 2022-05-26 DIAGNOSIS — E785 Hyperlipidemia, unspecified: Secondary | ICD-10-CM

## 2022-05-26 DIAGNOSIS — Z882 Allergy status to sulfonamides status: Secondary | ICD-10-CM | POA: Diagnosis not present

## 2022-05-26 DIAGNOSIS — I110 Hypertensive heart disease with heart failure: Secondary | ICD-10-CM | POA: Diagnosis present

## 2022-05-26 DIAGNOSIS — I5032 Chronic diastolic (congestive) heart failure: Secondary | ICD-10-CM | POA: Diagnosis present

## 2022-05-26 DIAGNOSIS — Z888 Allergy status to other drugs, medicaments and biological substances status: Secondary | ICD-10-CM | POA: Diagnosis not present

## 2022-05-26 DIAGNOSIS — Z880 Allergy status to penicillin: Secondary | ICD-10-CM | POA: Diagnosis not present

## 2022-05-26 DIAGNOSIS — R410 Disorientation, unspecified: Secondary | ICD-10-CM | POA: Diagnosis present

## 2022-05-26 DIAGNOSIS — N390 Urinary tract infection, site not specified: Secondary | ICD-10-CM | POA: Diagnosis present

## 2022-05-26 DIAGNOSIS — R600 Localized edema: Secondary | ICD-10-CM

## 2022-05-26 DIAGNOSIS — Z881 Allergy status to other antibiotic agents status: Secondary | ICD-10-CM | POA: Diagnosis not present

## 2022-05-26 DIAGNOSIS — Z885 Allergy status to narcotic agent status: Secondary | ICD-10-CM | POA: Diagnosis not present

## 2022-05-26 DIAGNOSIS — E538 Deficiency of other specified B group vitamins: Secondary | ICD-10-CM | POA: Diagnosis present

## 2022-05-26 DIAGNOSIS — F0390 Unspecified dementia without behavioral disturbance: Secondary | ICD-10-CM | POA: Diagnosis present

## 2022-05-26 DIAGNOSIS — I10 Essential (primary) hypertension: Secondary | ICD-10-CM

## 2022-05-26 DIAGNOSIS — F419 Anxiety disorder, unspecified: Secondary | ICD-10-CM | POA: Diagnosis present

## 2022-05-26 MED ORDER — PANTOPRAZOLE SODIUM 40 MG PO TBEC
40.0000 mg | DELAYED_RELEASE_TABLET | Freq: Every day | ORAL | Status: DC
Start: 2022-05-26 — End: 2022-05-26

## 2022-05-26 MED ORDER — ALPRAZOLAM 0.25 MG PO TABS
0.2500 mg | ORAL_TABLET | Freq: Three times a day (TID) | ORAL | Status: DC | PRN
Start: 2022-05-26 — End: 2022-05-30
  Administered 2022-05-28 – 2022-05-29 (×2): 0.25 mg via ORAL
  Filled 2022-05-26 (×3): qty 1

## 2022-05-26 MED ORDER — ACETAMINOPHEN 325 MG PO TABS: 650.0000 mg | ORAL_TABLET | Freq: Four times a day (QID) | ORAL | Status: DC | PRN

## 2022-05-26 MED ORDER — ENOXAPARIN SODIUM 40 MG/0.4ML IJ SOSY: 40.0000 mg | PREFILLED_SYRINGE | INTRAMUSCULAR | Status: DC

## 2022-05-26 MED ORDER — SODIUM CHLORIDE 0.9 % IV SOLN: INTRAVENOUS | Status: DC | PRN

## 2022-05-26 MED ORDER — HALOPERIDOL LACTATE 5 MG/ML IJ SOLN
1.0000 mg | Freq: Once | INTRAMUSCULAR | Status: AC
Start: 2022-05-26 — End: 2022-05-26
  Administered 2022-05-26: 1 mg via INTRAVENOUS
  Filled 2022-05-26: qty 1

## 2022-05-26 MED ORDER — METOPROLOL SUCCINATE ER 25 MG PO TB24
25.0000 mg | ORAL_TABLET | Freq: Every day | ORAL | Status: DC
  Administered 2022-05-26 – 2022-05-30 (×5): 25 mg via ORAL
  Filled 2022-05-26 (×5): qty 1

## 2022-05-26 MED ORDER — ACETAMINOPHEN 650 MG RE SUPP: 650.0000 mg | Freq: Four times a day (QID) | RECTAL | Status: DC | PRN

## 2022-05-26 MED ORDER — DONEPEZIL HCL 10 MG PO TABS
10.0000 mg | ORAL_TABLET | Freq: Every day | ORAL | Status: DC
Start: 2022-05-26 — End: 2022-05-26

## 2022-05-26 MED ORDER — QUETIAPINE FUMARATE 25 MG PO TABS
12.5000 mg | ORAL_TABLET | Freq: Every evening | ORAL | Status: DC | PRN
  Administered 2022-05-26 – 2022-05-29 (×3): 12.5 mg via ORAL
  Filled 2022-05-26 (×3): qty 1

## 2022-05-26 MED ORDER — FUROSEMIDE 10 MG/ML IJ SOLN
40.0000 mg | Freq: Every day | INTRAMUSCULAR | Status: DC
  Administered 2022-05-26 – 2022-05-27 (×2): 40 mg via INTRAVENOUS
  Filled 2022-05-26 (×2): qty 4

## 2022-05-26 MED ORDER — ENOXAPARIN SODIUM 30 MG/0.3ML IJ SOSY
30.0000 mg | PREFILLED_SYRINGE | INTRAMUSCULAR | Status: DC
  Administered 2022-05-26: 30 mg via SUBCUTANEOUS
  Filled 2022-05-26: qty 0.3

## 2022-05-26 MED ORDER — SODIUM CHLORIDE 0.9 % IV SOLN
1.0000 g | Freq: Three times a day (TID) | INTRAVENOUS | Status: AC
  Administered 2022-05-26 – 2022-05-28 (×9): 1 g via INTRAVENOUS
  Filled 2022-05-26 (×16): qty 5

## 2022-05-26 NOTE — Progress Notes (Signed)
Pharmacy Antibiotic Note  Shari Scott is a 88 y.o. female admitted on 05/25/2022 with UTI.  Pharmacy has been consulted for aztreonam dosing.  Of note patient is currently altered and has many noted drug allergies but no reactions listed. Cr/Cl ~29 ml/min  Plan: Aztreonam 1 G IV q8 hours  Height: 5\' 2"  (157.5 cm) Weight: 59 kg (130 lb) IBW/kg (Calculated) : 50.1  Temp (24hrs), Avg:97.9 F (36.6 C), Min:97.9 F (36.6 C), Max:97.9 F (36.6 C)  Recent Labs  Lab 05/25/22 1838  WBC 6.4  CREATININE 0.94    Estimated Creatinine Clearance: 28.9 mL/min (by C-G formula based on SCr of 0.94 mg/dL).    Allergies  Allergen Reactions   Penicillins Other (See Comments)    Other Reaction: Allergy   Sulfa Antibiotics Other (See Comments)    Other Reaction: Allergy   Diclofenac    Doxycycline    Morphine Other (See Comments)   Septra [Sulfamethoxazole-Trimethoprim]     Antimicrobials this admission: Aztreonam 6/11>>  Microbiology results: pending  Thank you for allowing pharmacy to be a part of this patient's care.  07/25/22 Telisha Zawadzki 05/26/2022 1:06 AM

## 2022-05-26 NOTE — Progress Notes (Addendum)
This AC called to 6N by charge nurse regarding care for this patient.  The pt's son and sole caregiver, Verneal Wiers, presented to the ED and ended up having emergency surgery and was subsequently admitted to 6N09.  The patient was with him when he came to the ED and followed his hospital course through surgery and admission.  However, the evening of 05/24/22 pt began to possibly sun down and became more confused, disoriented and combative into the early morning when staff tried to assist her after being incontinent in the pt's room. This AC spoke with pt's son/ sole caregiver who expressed that [facility] had visited him in his room since admission and arranged to pick her up on Monday. However, there was no contact made with nursing staff or social work regarding this plan and the son/caregiver had no contact info for who he spoke with or who would be picking her up.  This AC assisted son/caregiver with phone calls to other family members and finally a neighbor offered to pick her up but another family member threatened to call the police if they did. This AC then reached out to the [employer] on call and Education officer, museum (met at caregivers bedside) to assist.  This [facility] was advised by Data processing manager that the best way to assist this patient and family would be to take to ED for evaluation.  Social Worker also advised they'd be able to better assist with her being a patient. This AC called and notified ED charge nurse and was advised to take pt to triage area.  This AC and 1 NA transported pt via wheelchair to ED Triage area, per direction of ED charge nurse, and advised the receiving nurse that swelling was also noted to pt's bilateral lower extremities during transport.  Receiving nurse gave complete verbal understanding and was advised to contact [facility] if any further needs arise.

## 2022-05-26 NOTE — ED Provider Notes (Signed)
[facility] EMERGENCY DEPARTMENT Provider Note   CSN: [id number] Arrival date & time: 05/25/22  1646     History  Chief Complaint  Patient presents with   Altered Mental Status    Shari Scott is a 88 y.o. female.  The history is provided by medical records and the patient. The history is limited by the condition of the patient (level 5 caveat dementia).  Altered Mental Status Presenting symptoms: confusion, disorientation and memory loss   Severity:  Severe Episode history:  Unable to specify Timing:  Constant Progression:  Unable to specify Chronicity:  New Context: dementia   Associated symptoms: agitation   Associated symptoms: no fever and no vomiting   Patient with dementia was visiting with her care giver who is reportedly a patient in the hospital and he had the patient sent to the ED for evaluation in the ED.       Home Medications Prior to Admission medications   Medication Sig Start Date End Date Taking? Authorizing Provider  ALPRAZolam (XANAX) 0.25 MG tablet Take 1 tablet (0.25 mg total) by mouth 3 (three) times daily as needed for anxiety. 06/06/15   Toy Cookey, MD  donepezil (ARICEPT) 10 MG tablet Take 10 mg by mouth at bedtime.    [provider]  metoprolol succinate (TOPROL-XL) 25 MG 24 hr tablet Take 25 mg by mouth daily.    [provider]      Allergies    Penicillins, Sulfa antibiotics, Diclofenac, Doxycycline, Morphine, and Septra [sulfamethoxazole-trimethoprim]    Review of Systems   Review of Systems  Unable to perform ROS: Dementia  Constitutional:  Negative for fever.  HENT:  Negative for facial swelling.   Eyes:  Negative for redness.  Respiratory:  Negative for wheezing and stridor.   Gastrointestinal:  Negative for vomiting.  Psychiatric/Behavioral:  Positive for agitation, confusion and memory loss.     Physical Exam Updated Vital Signs BP (!) 168/81 (BP Location: Right Arm)   Pulse 86    Temp 97.9 F (36.6 C) (Oral)   Resp 19   Ht 5\' 2"  (1.575 m)   Wt 59 kg   SpO2 100%   BMI 23.78 kg/m  Physical Exam Vitals and nursing note reviewed. Exam conducted with a chaperone present.  Constitutional:      General: She is not in acute distress.    Appearance: She is well-developed.  HENT:     Head: Normocephalic and atraumatic.     Nose: Nose normal.  Eyes:     Conjunctiva/sclera: Conjunctivae normal.     Pupils: Pupils are equal, round, and reactive to light.     Comments: Normal appearance  Cardiovascular:     Rate and Rhythm: Normal rate and regular rhythm.     Pulses: Normal pulses.     Heart sounds: Normal heart sounds.  Pulmonary:     Effort: Pulmonary effort is normal. No respiratory distress.     Breath sounds: Normal breath sounds.  Abdominal:     General: Bowel sounds are normal. There is no distension.     Palpations: Abdomen is soft. There is no mass.     Tenderness: There is no abdominal tenderness. There is no guarding or rebound.  Genitourinary:    Comments: No CVA tenderness Musculoskeletal:        General: Normal range of motion.     Cervical back: Normal range of motion and neck supple.     Right lower leg: Edema present.  Left lower leg: Edema present.  Skin:    General: Skin is warm and dry.     Findings: No rash.  Neurological:     General: No focal deficit present.     Mental Status: She is alert.     Deep Tendon Reflexes: Reflexes normal.     Comments: AO1     ED Results / Procedures / Treatments   Labs (all labs ordered are listed, but only abnormal results are displayed) Results for orders placed or performed during the hospital encounter of 05/25/22  CBC with Differential  Result Value Ref Range   WBC 6.4 4.0 - 10.5 K/uL   RBC 4.92 3.87 - 5.11 MIL/uL   Hemoglobin 14.2 12.0 - 15.0 g/dL   HCT 48.1 85.6 - 31.4 %   MCV 91.7 80.0 - 100.0 fL   MCH 28.9 26.0 - 34.0 pg   MCHC 31.5 30.0 - 36.0 g/dL   RDW 97.0 26.3 - 78.5 %    Platelets 282 150 - 400 K/uL   nRBC 0.0 0.0 - 0.2 %   Neutrophils Relative % 53 %   Neutro Abs 3.4 1.7 - 7.7 K/uL   Lymphocytes Relative 36 %   Lymphs Abs 2.3 0.7 - 4.0 K/uL   Monocytes Relative 8 %   Monocytes Absolute 0.5 0.1 - 1.0 K/uL   Eosinophils Relative 2 %   Eosinophils Absolute 0.1 0.0 - 0.5 K/uL   Basophils Relative 1 %   Basophils Absolute 0.1 0.0 - 0.1 K/uL   Immature Granulocytes 0 %   Abs Immature Granulocytes 0.01 0.00 - 0.07 K/uL  Comprehensive metabolic panel  Result Value Ref Range   Sodium 134 (L) 135 - 145 mmol/L   Potassium 4.0 3.5 - 5.1 mmol/L   Chloride 102 98 - 111 mmol/L   CO2 22 22 - 32 mmol/L   Glucose, Bld 98 70 - 99 mg/dL   BUN 26 (H) 8 - 23 mg/dL   Creatinine, Ser 8.85 0.44 - 1.00 mg/dL   Calcium 9.3 8.9 - 02.7 mg/dL   Total Protein 7.1 6.5 - 8.1 g/dL   Albumin 3.6 3.5 - 5.0 g/dL   AST 17 15 - 41 U/L   ALT 14 0 - 44 U/L   Alkaline Phosphatase 130 (H) 38 - 126 U/L   Total Bilirubin 0.6 0.3 - 1.2 mg/dL   GFR, Estimated 56 (L) >60 mL/min   Anion gap 10 5 - 15  Urinalysis, Routine w reflex microscopic Urine, Clean Catch  Result Value Ref Range   Color, Urine YELLOW YELLOW   APPearance HAZY (A) CLEAR   Specific Gravity, Urine 1.018 1.005 - 1.030   pH 5.0 5.0 - 8.0   Glucose, UA NEGATIVE NEGATIVE mg/dL   Hgb urine dipstick NEGATIVE NEGATIVE   Bilirubin Urine NEGATIVE NEGATIVE   Ketones, ur NEGATIVE NEGATIVE mg/dL   Protein, ur NEGATIVE NEGATIVE mg/dL   Nitrite NEGATIVE NEGATIVE   Leukocytes,Ua MODERATE (A) NEGATIVE   RBC / HPF 0-5 0 - 5 RBC/hpf   WBC, UA 11-20 0 - 5 WBC/hpf   Bacteria, UA RARE (A) NONE SEEN   Squamous Epithelial / LPF 0-5 0 - 5   Mucus PRESENT   Brain natriuretic peptide  Result Value Ref Range   B Natriuretic Peptide 90.8 0.0 - 100.0 pg/mL   CT Head Wo Contrast  Result Date: 05/26/2022 CLINICAL DATA:  Facial trauma, penetrating EXAM: CT HEAD WITHOUT CONTRAST TECHNIQUE: Contiguous axial images were obtained from the  base  of the skull through the vertex without intravenous contrast. RADIATION DOSE REDUCTION: This exam was performed according to the departmental dose-optimization program which includes automated exposure control, adjustment of the mA and/or kV according to patient size and/or use of iterative reconstruction technique. COMPARISON:  12/02/2021 FINDINGS: Brain: There is atrophy and chronic small vessel disease changes. No acute intracranial abnormality. Specifically, no hemorrhage, hydrocephalus, mass lesion, acute infarction, or significant intracranial injury. Vascular: No hyperdense vessel or unexpected calcification. Skull: No acute calvarial abnormality. Sinuses/Orbits: No acute findings Other: None IMPRESSION: Atrophy, chronic microvascular disease. No acute intracranial abnormality. Electronically Signed   By: Charlett NoseKevin  Dover M.D.   On: 05/26/2022 03:12   DG Chest 2 View  Result Date: 05/26/2022 CLINICAL DATA:  Altered mental status. EXAM: CHEST - 2 VIEW COMPARISON:  Chest radiograph dated 12/03/2021. FINDINGS: Left lung base linear atelectasis/scarring. No focal consolidation, pleural effusion, or pneumothorax. The cardiac silhouette is within normal limits. Atherosclerotic calcification of the aorta. Median sternotomy wires and CABG vascular clips. Degenerative changes of the spine. No acute osseous pathology. IMPRESSION: No active cardiopulmonary disease. Electronically Signed   By: Elgie CollardArash  Radparvar M.D.   On: 05/26/2022 00:50      Radiology CT Head Wo Contrast  Result Date: 05/26/2022 CLINICAL DATA:  Facial trauma, penetrating EXAM: CT HEAD WITHOUT CONTRAST TECHNIQUE: Contiguous axial images were obtained from the base of the skull through the vertex without intravenous contrast. RADIATION DOSE REDUCTION: This exam was performed according to the departmental dose-optimization program which includes automated exposure control, adjustment of the mA and/or kV according to patient size and/or use of  iterative reconstruction technique. COMPARISON:  12/02/2021 FINDINGS: Brain: There is atrophy and chronic small vessel disease changes. No acute intracranial abnormality. Specifically, no hemorrhage, hydrocephalus, mass lesion, acute infarction, or significant intracranial injury. Vascular: No hyperdense vessel or unexpected calcification. Skull: No acute calvarial abnormality. Sinuses/Orbits: No acute findings Other: None IMPRESSION: Atrophy, chronic microvascular disease. No acute intracranial abnormality. Electronically Signed   By: Charlett NoseKevin  Dover M.D.   On: 05/26/2022 03:12   DG Chest 2 View  Result Date: 05/26/2022 CLINICAL DATA:  Altered mental status. EXAM: CHEST - 2 VIEW COMPARISON:  Chest radiograph dated 12/03/2021. FINDINGS: Left lung base linear atelectasis/scarring. No focal consolidation, pleural effusion, or pneumothorax. The cardiac silhouette is within normal limits. Atherosclerotic calcification of the aorta. Median sternotomy wires and CABG vascular clips. Degenerative changes of the spine. No acute osseous pathology. IMPRESSION: No active cardiopulmonary disease. Electronically Signed   By: Elgie CollardArash  Radparvar M.D.   On: 05/26/2022 00:50    Procedures Procedures    Medications Ordered in ED Medications  aztreonam (AZACTAM) 1 g in sodium chloride 0.9 % 100 mL IVPB (1 g Intravenous New Bag/Given 05/26/22 0203)  enoxaparin (LOVENOX) injection 40 mg (has no administration in time range)  acetaminophen (TYLENOL) tablet 650 mg (has no administration in time range)    Or  acetaminophen (TYLENOL) suppository 650 mg (has no administration in time range)  haloperidol lactate (HALDOL) injection 1 mg (1 mg Intravenous Given 05/26/22 0159)    ED Course/ Medical Decision Making/ A&P                           Medical Decision Making Patient sent to the ED by care giver for evaluation   Problems Addressed: Acute cystitis without hematuria: acute illness or injury    Details: likely with  associated delirium, treating with aztreonam given allergies Altered mental status,  unspecified altered mental status type: complicated acute illness or injury    Details: likely secondary to UTI, will admit  Amount and/or Complexity of Data Reviewed External Data Reviewed: notes.    Details: previous notes reviewed Labs: ordered.    Details: all labs reviewed: urine is consistent with UTI.  Normal white count 6.4 and normal hemoglobin 14.2 and normal platelets.  Normal potassium, elevated BUN at 26 and normal creatinine .94 Radiology: ordered and independent interpretation performed.    Details: no acute findings on CT head or CXR by me Discussion of management or test interpretation with external provider(s): To be admitted by Dr. Loney Loh   Risk Prescription drug management. Decision regarding hospitalization. Risk Details: The patient appears reasonably stabilized for admission considering the current resources, flow, and capabilities available in the ED at this time, and I doubt any other [facility] requiring further screening and/or treatment in the ED prior to admission.     Final Clinical Impression(s) / ED Diagnoses Final diagnoses:  Acute cystitis without hematuria  Altered mental status, unspecified altered mental status type    Rx / DC Orders ED Discharge Orders     None         Telissa Palmisano, MD 05/26/22 1610

## 2022-05-26 NOTE — ED Notes (Signed)
Pt ambulatory to restroom with steady gait 1+ staff assist. Pt assisted back in bed and lunch tray set up at bedside.

## 2022-05-26 NOTE — H&P (Signed)
History and Physical    Shari BabinskiMartha T Large ZOX:[id number] DOB: 09/23/1926 DOA: 05/25/2022  PCP: Default, Provider, MD  Patient coming from: Home  Chief Complaint: AMS  HPI: Shari Scott is a 88 y.o. female with medical history significant of dementia, hypertension, hyperlipidemia, chronic diastolic CHF, anxiety presented to the ED for evaluation of altered mental status.  Reportedly the patient's son is admitted to the hospital and when she went to visit him, it was noted that she was altered and was sent to the ED to be evaluated.  Afebrile.  Labs showing WBC 6.4, hemoglobin 14.2, platelet count 282k.  Sodium 134, potassium 4.0, chloride 102, bicarb 22, BUN 26, creatinine 0.9, glucose 98.  UA with negative nitrite, moderate amount of leukocytes, and microscopy showing 11-20 WBCs and rare bacteria.  BNP normal.  Chest x-ray showing no active cardiopulmonary disease.  CT head negative for acute finding. Patient was given Haldol and aztreonam.  Patient is confused and does not know why she is in the emergency room.  She is able to tell me her name and date of birth.  Not oriented to time.  [place] the name of the hospital.  States she lives with her son who is currently admitted to the hospital here.  She has no complaints.  Denies fevers, cough, shortness of breath, chest pain, nausea, vomiting, abdominal pain, diarrhea, or dysuria.  Reports chronic bilateral lower extremity edema.  Review of Systems:  Review of Systems  All other systems reviewed and are negative.   Past Medical History:  Diagnosis Date   Dementia (HCC)    Hypertension     Past Surgical History:  Procedure Laterality Date   CARDIAC SURGERY     COLON SURGERY       reports that she has never smoked. She does not have any smokeless tobacco history on file. She reports that she does not drink alcohol and does not use drugs.  Allergies  Allergen Reactions   Penicillins  Other (See Comments)    Other Reaction: Allergy   Sulfa Antibiotics Other (See Comments)    Other Reaction: Allergy   Diclofenac    Doxycycline    Morphine Other (See Comments)   Septra [Sulfamethoxazole-Trimethoprim]     History reviewed. No pertinent family history.  Prior to Admission medications   Medication Sig Start Date End Date Taking? Authorizing Provider  ALPRAZolam (XANAX) 0.25 MG tablet Take 1 tablet (0.25 mg total) by mouth 3 (three) times daily as needed for anxiety. 06/06/15   Toy Cookeyocherty, Megan, MD  donepezil (ARICEPT) 10 MG tablet Take 10 mg by mouth at bedtime.    [provider]  metoprolol succinate (TOPROL-XL) 25 MG 24 hr tablet Take 25 mg by mouth daily.    [provider]    Physical Exam: Vitals:   05/25/22 1711 05/25/22 1715 05/25/22 2021 05/26/22 0206  BP: (!) 164/73  (!) 168/84 (!) 168/81  Pulse: 80  82 86  Resp: 18  18 19   Temp: 97.9 F (36.6 C)     TempSrc: Oral     SpO2: 97%  98% 100%  Weight:  59 kg    Height:  5\' 2"  (1.575 m)      Physical Exam Vitals reviewed.  Constitutional:      General: She is not in acute distress. HENT:     Head: Normocephalic and atraumatic.  Eyes:     Extraocular Movements: Extraocular  movements intact.  Cardiovascular:     Rate and Rhythm: Normal rate and regular rhythm.     Pulses: Normal pulses.  Pulmonary:     Effort: Pulmonary effort is normal. No respiratory distress.     Breath sounds: Normal breath sounds. No wheezing or rales.  Abdominal:     General: Bowel sounds are normal. There is no distension.     Palpations: Abdomen is soft.     Tenderness: There is no abdominal tenderness.  Musculoskeletal:     Cervical back: Normal range of motion. No rigidity.     Right lower leg: Edema present.     Left lower leg: Edema present.     Comments: 4+ pitting edema of bilateral lower extremities, left lower extremity larger in size  Skin:    General: Skin is warm and dry.  Neurological:      General: No focal deficit present.     Mental Status: She is alert.     Cranial Nerves: No cranial nerve deficit.     Sensory: No sensory deficit.     Motor: No weakness.     Comments: Oriented to self She knows she is at a hospital in [place] but does not know the name of the hospital. Not oriented to time.      Labs on Admission: I have personally reviewed following labs and imaging studies  CBC: Recent Labs  Lab 05/25/22 1838  WBC 6.4  NEUTROABS 3.4  HGB 14.2  HCT 45.1  MCV 91.7  PLT 282   Basic Metabolic Panel: Recent Labs  Lab 05/25/22 1838  NA 134*  K 4.0  CL 102  CO2 22  GLUCOSE 98  BUN 26*  CREATININE 0.94  CALCIUM 9.3   GFR: Estimated Creatinine Clearance: 28.9 mL/min (by C-G formula based on SCr of 0.94 mg/dL). Liver Function Tests: Recent Labs  Lab 05/25/22 1838  AST 17  ALT 14  ALKPHOS 130*  BILITOT 0.6  PROT 7.1  ALBUMIN 3.6   No results for input(s): "LIPASE", "AMYLASE" in the last 168 hours. No results for input(s): "AMMONIA" in the last 168 hours. Coagulation Profile: No results for input(s): "INR", "PROTIME" in the last 168 hours. Cardiac Enzymes: No results for input(s): "CKTOTAL", "CKMB", "CKMBINDEX", "TROPONINI" in the last 168 hours. BNP (last 3 results) No results for input(s): "PROBNP" in the last 8760 hours. HbA1C: No results for input(s): "HGBA1C" in the last 72 hours. CBG: No results for input(s): "GLUCAP" in the last 168 hours. Lipid Profile: No results for input(s): "CHOL", "HDL", "LDLCALC", "TRIG", "CHOLHDL", "LDLDIRECT" in the last 72 hours. Thyroid Function Tests: No results for input(s): "TSH", "T4TOTAL", "FREET4", "T3FREE", "THYROIDAB" in the last 72 hours. Anemia Panel: No results for input(s): "VITAMINB12", "FOLATE", "FERRITIN", "TIBC", "IRON", "RETICCTPCT" in the last 72 hours. Urine analysis:    Component Value Date/Time   COLORURINE YELLOW 05/25/2022 1728   APPEARANCEUR HAZY (A) 05/25/2022 1728   LABSPEC  1.018 05/25/2022 1728   PHURINE 5.0 05/25/2022 1728   GLUCOSEU NEGATIVE 05/25/2022 1728   HGBUR NEGATIVE 05/25/2022 1728   BILIRUBINUR NEGATIVE 05/25/2022 1728   KETONESUR NEGATIVE 05/25/2022 1728   PROTEINUR NEGATIVE 05/25/2022 1728   UROBILINOGEN 0.2 06/09/2015 1654   NITRITE NEGATIVE 05/25/2022 1728   LEUKOCYTESUR MODERATE (A) 05/25/2022 1728    Radiological Exams on Admission: I have personally reviewed images CT Head Wo Contrast  Result Date: 05/26/2022 CLINICAL DATA:  Facial trauma, penetrating EXAM: CT HEAD WITHOUT CONTRAST TECHNIQUE: Contiguous axial images were obtained  from the base of the skull through the vertex without intravenous contrast. RADIATION DOSE REDUCTION: This exam was performed according to the departmental dose-optimization program which includes automated exposure control, adjustment of the mA and/or kV according to patient size and/or use of iterative reconstruction technique. COMPARISON:  12/02/2021 FINDINGS: Brain: There is atrophy and chronic small vessel disease changes. No acute intracranial abnormality. Specifically, no hemorrhage, hydrocephalus, mass lesion, acute infarction, or significant intracranial injury. Vascular: No hyperdense vessel or unexpected calcification. Skull: No acute calvarial abnormality. Sinuses/Orbits: No acute findings Other: None IMPRESSION: Atrophy, chronic microvascular disease. No acute intracranial abnormality. Electronically Signed   By: Charlett Nose M.D.   On: 05/26/2022 03:12   DG Chest 2 View  Result Date: 05/26/2022 CLINICAL DATA:  Altered mental status. EXAM: CHEST - 2 VIEW COMPARISON:  Chest radiograph dated 12/03/2021. FINDINGS: Left lung base linear atelectasis/scarring. No focal consolidation, pleural effusion, or pneumothorax. The cardiac silhouette is within normal limits. Atherosclerotic calcification of the aorta. Median sternotomy wires and CABG vascular clips. Degenerative changes of the spine. No acute osseous  pathology. IMPRESSION: No active cardiopulmonary disease. Electronically Signed   By: Elgie Collard M.D.   On: 05/26/2022 00:50    EKG: Independently reviewed.  Sinus rhythm, artifact.  Assessment and Plan  Possible UTI UA with negative nitrite, moderate amount of leukocytes, and microscopy showing 11-20 WBCs and rare bacteria.  No fever or leukocytosis. -Continue aztreonam -Urine culture  Acute metabolic encephalopathy Possibly due to UTI.  ?Polypharmacy, pharmacy med rec pending.  CT head negative for acute finding and no focal neurodeficit on exam.  No fever or meningeal signs.   -Continue antibiotic for UTI -Check TSH, B12, and ammonia levels.  Chronic diastolic CHF Echo done in July 2018 showing EF 55 to 60%, mild diastolic dysfunction, mild aortic stenosis, and mild mitral regurgitation.  She has significant peripheral edema but BNP normal and chest x-ray without evidence of pulmonary edema. -IV Lasix 40 mg daily -Monitor renal function  Bilateral lower extremity edema -Lasix as mentioned above -Dopplers ordered to rule out DVT  Dementia Hypertension Hyperlipidemia -Pharmacy med rec pending.  DVT prophylaxis: Lovenox Code Status: Full Code Family Communication: No family available at this time. Level of care: Med-Surg Admission status: It is my clinical opinion that referral for OBSERVATION is reasonable and necessary in this patient based on the above information provided. The aforementioned taken together are felt to place the patient at high risk for further clinical deterioration. However, it is anticipated that the patient may be medically stable for discharge from the hospital within 24 to 48 hours.   John Giovanni MD [facility]  If 7PM-7AM, please contact night-coverage [email or web address]  05/26/2022, 3:28 AM

## 2022-05-26 NOTE — ED Notes (Signed)
Difficulty with PIV insertion, multiple sticks overnight. [place] IV placed in R AC and is occluded. IV team consult order placed.

## 2022-05-26 NOTE — Progress Notes (Addendum)
I have seen and assessed patient and agree with Dr.Rathore's assessment and plan.  Patient 88 year old female with history of dementia, hypertension, hyperlipidemia, chronic diastolic CHF, anxiety presented to the ED with altered mental status.  Patient seen in the ED assessed urinalysis concerning for UTI and patient placed empirically on IV antibiotics.  Patient also noted to have a lower extremity edema placed on IV Lasix.  Urine cultures pending.  Check a TSH, vitamin B12, ammonia level.  No charge.

## 2022-05-26 NOTE — Progress Notes (Signed)
VASCULAR LAB    Bilateral lower extremity venous duplex has been performed.  See CV proc for preliminary results.   Trip Cavanagh, RVT 05/26/2022, 11:04 AM

## 2022-05-26 NOTE — Progress Notes (Signed)
NEW ADMISSION NOTE New Admission Note:   Arrival Method: ED stretcher Mental Orientation: AAOx1 Telemetry: n/a Assessment: Completed Skin: MASD under breasts, left hip abrasion IV: RFA Pain: 0./10 Tubes: n/a Safety Measures: Safety Fall Prevention Plan has been given, discussed and signed Admission: Completed [facility] Orientation: Patient has been orientated to the room, unit and staff.  Family: none at bedside  Orders have been reviewed and implemented. Will continue to monitor the patient. Call light has been placed within reach and bed alarm has been activated.   Myrtis Hopping, RN

## 2022-05-26 NOTE — ED Notes (Signed)
Pt ambulatory with steady gait to restroom with x1 staff assist. Pt repositioned and settled back into bed. Breakfast tray given back to pt. Pt oriented to current situation and reassured to talk to son soon. Warm blankets placed over pt's feet per request. Denies any further needs at this time.

## 2022-05-26 NOTE — ED Notes (Signed)
Pt transported to vascular. Pt's son, Onalee Hua, called and given update on current status. Contact information [phone].

## 2022-05-27 DIAGNOSIS — G9341 Metabolic encephalopathy: Secondary | ICD-10-CM | POA: Diagnosis not present

## 2022-05-27 DIAGNOSIS — R600 Localized edema: Secondary | ICD-10-CM | POA: Diagnosis not present

## 2022-05-27 DIAGNOSIS — N3000 Acute cystitis without hematuria: Secondary | ICD-10-CM | POA: Diagnosis not present

## 2022-05-27 DIAGNOSIS — N390 Urinary tract infection, site not specified: Secondary | ICD-10-CM | POA: Diagnosis not present

## 2022-05-27 LAB — CBC WITH DIFFERENTIAL/PLATELET
Abs Immature Granulocytes: 0.01 10*3/uL (ref 0.00–0.07)
Basophils Absolute: 0 10*3/uL (ref 0.0–0.1)
Basophils Relative: 1 %
Eosinophils Absolute: 0.2 10*3/uL (ref 0.0–0.5)
Eosinophils Relative: 3 %
HCT: 43.4 % (ref 36.0–46.0)
Hemoglobin: 14.6 g/dL (ref 12.0–15.0)
Immature Granulocytes: 0 %
Lymphocytes Relative: 40 %
Lymphs Abs: 2.8 10*3/uL (ref 0.7–4.0)
MCH: 30 pg (ref 26.0–34.0)
MCHC: 33.6 g/dL (ref 30.0–36.0)
MCV: 89.1 fL (ref 80.0–100.0)
Monocytes Absolute: 0.6 10*3/uL (ref 0.1–1.0)
Monocytes Relative: 8 %
Neutro Abs: 3.4 10*3/uL (ref 1.7–7.7)
Neutrophils Relative %: 48 %
Platelets: 259 10*3/uL (ref 150–400)
RBC: 4.87 MIL/uL (ref 3.87–5.11)
RDW: 15 % (ref 11.5–15.5)
WBC: 7.1 10*3/uL (ref 4.0–10.5)
nRBC: 0 % (ref 0.0–0.2)

## 2022-05-27 LAB — BASIC METABOLIC PANEL
Anion gap: 11 (ref 5–15)
BUN: 23 mg/dL (ref 8–23)
CO2: 25 mmol/L (ref 22–32)
Calcium: 9.2 mg/dL (ref 8.9–10.3)
Chloride: 102 mmol/L (ref 98–111)
Creatinine, Ser: 1.01 mg/dL — ABNORMAL HIGH (ref 0.44–1.00)
GFR, Estimated: 52 mL/min — ABNORMAL LOW (ref >60)
Glucose, Bld: 102 mg/dL — ABNORMAL HIGH (ref 70–99)
Potassium: 3.8 mmol/L (ref 3.5–5.1)
Sodium: 138 mmol/L (ref 135–145)

## 2022-05-27 LAB — BRAIN NATRIURETIC PEPTIDE: B Natriuretic Peptide: 110.7 pg/mL — ABNORMAL HIGH (ref 0.0–100.0)

## 2022-05-27 MED ORDER — TORSEMIDE 20 MG PO TABS
10.0000 mg | ORAL_TABLET | Freq: Every day | ORAL | Status: DC
  Administered 2022-05-28 – 2022-05-30 (×3): 10 mg via ORAL
  Filled 2022-05-27 (×3): qty 1

## 2022-05-27 MED ORDER — ADULT MULTIVITAMIN W/MINERALS CH
1.0000 | ORAL_TABLET | Freq: Every day | ORAL | Status: DC
  Administered 2022-05-27 – 2022-05-30 (×4): 1 via ORAL
  Filled 2022-05-27 (×4): qty 1

## 2022-05-27 MED ORDER — SPIRONOLACTONE 25 MG PO TABS
25.0000 mg | ORAL_TABLET | Freq: Every day | ORAL | Status: DC
  Administered 2022-05-27 – 2022-05-30 (×4): 25 mg via ORAL
  Filled 2022-05-27 (×4): qty 1

## 2022-05-27 MED ORDER — DONEPEZIL HCL 10 MG PO TABS
10.0000 mg | ORAL_TABLET | Freq: Every day | ORAL | Status: DC
  Administered 2022-05-27 – 2022-05-29 (×3): 10 mg via ORAL
  Filled 2022-05-27 (×3): qty 1

## 2022-05-27 MED ORDER — ENSURE ENLIVE PO LIQD
237.0000 mL | Freq: Two times a day (BID) | ORAL | Status: DC
Start: 2022-05-28 — End: 2022-05-30
  Administered 2022-05-28 – 2022-05-30 (×5): 237 mL via ORAL

## 2022-05-27 MED ORDER — PANTOPRAZOLE SODIUM 40 MG PO TBEC
40.0000 mg | DELAYED_RELEASE_TABLET | Freq: Every day | ORAL | Status: DC
  Administered 2022-05-27 – 2022-05-30 (×4): 40 mg via ORAL
  Filled 2022-05-27 (×4): qty 1

## 2022-05-27 MED ORDER — ENOXAPARIN SODIUM 40 MG/0.4ML IJ SOSY
40.0000 mg | PREFILLED_SYRINGE | INTRAMUSCULAR | Status: DC
  Administered 2022-05-27: 40 mg via SUBCUTANEOUS
  Filled 2022-05-27: qty 0.4

## 2022-05-27 MED ORDER — ATORVASTATIN CALCIUM 10 MG PO TABS
10.0000 mg | ORAL_TABLET | Freq: Every day | ORAL | Status: DC
  Administered 2022-05-27 – 2022-05-30 (×4): 10 mg via ORAL
  Filled 2022-05-27 (×4): qty 1

## 2022-05-27 NOTE — Progress Notes (Signed)
Mobility Specialist Criteria Algorithm Info.   05/27/22 1615  Mobility  Activity Refused mobility;Moved into chair position in bed (Defer ambulation)  Range of Motion/Exercises Active;All extremities  Level of Assistance Standby assist, set-up cues, supervision of patient - no hands on  Assistive Device None  Activity Response Tolerated well   Patient received in bed asleep but easily aroused. Deferred OOB mobility or ambulation but agreeable to participate in UE/LE exercises. Was able to demonstrate exercises with supervision. Tolerated without complaint or incident. Was left sidelying in bed with all needs met, call bell in reach.   05/27/2022 4:15 PM  Shari Scott, [place], [place]  GGYIR:[phone] Office: [phone]

## 2022-05-27 NOTE — Progress Notes (Signed)
PROGRESS NOTE    Shari Scott  ZOX:[id number] DOB: 1926-12-12 DOA: 05/25/2022 PCP: Default, Provider, MD    Chief Complaint  Patient presents with   Altered Mental Status    Brief Narrative:  Patient pleasant 88 year old lady history of dementia, hypertension, hyperlipidemia, chronic diastolic CHF, anxiety presented to the ED for evaluation of altered mental status.  It is noted the patient's son is admitted to the hospital and when she went to visit them she was noted to be altered and sent to the ED.  Work-up concerning for UTI.  Head CT negative.  Patient placed on empiric IV antibiotics while urine cultures pending.   Assessment & Plan:   Principal Problem:   UTI (urinary tract infection) Active Problems:   Acute metabolic encephalopathy   Chronic diastolic CHF (congestive heart failure) (HCC)   Bilateral lower extremity edema   Dementia (HCC)   Essential hypertension   Hyperlipidemia  #1 probable UTI -Patient presented with altered mental status.  Urinalysis concerning for UTI. -Patient afebrile, normal white count. -Urine cultures pending. -IV aztreonam day 2/3. -Supportive care.  2.  Acute metabolic encephalopathy -Felt likely secondary to UTI in the setting of dementia,?  Polypharmacy. -CT head negative. -Urinalysis concerning for UTI, urine cultures pending. -Check a TSH, ammonia level, folic acid, vitamin B12 levels -Continue IV antibiotics. -Supportive care.  3.  Chronic diastolic CHF -2D echo from July 2018 EF of 55 to 60%, mild diastolic dysfunction, mild aortic stenosis and mild MR. -Per admitting physician patient noted to have significant peripheral edema but BNP was within normal limits.  Chest x-ray with no acute pulmonary edema. -Patient placed on Lasix 40 mg IV daily. -Urine output of 750 cc recorded over the past 24 hours. -Transition back to home regimen torsemide. -Resume home regimen Lipitor, Toprol-XL, Aldactone.  4.  Bilateral lower  extremity edema -2D echo from July 2018 with EF of 55 to 60%, mild diastolic dysfunction, mild aortic stenosis, mild MR. -Chest x-ray with no pulmonary edema noted. -Lower extremity Dopplers negative for DVT. -Patient placed on IV Lasix will transition back to home regimen oral torsemide. -Resume home regimen Aldactone. -Outpatient follow-up with PCP/cardiology.  5.  Dementia -Stable. -Resume home regimen Aricept. -Seroquel nightly as needed agitation.  6.  Hyperlipidemia -Resume home regimen statin.  7.  Hypertension Continue Toprol-XL.  Resume home regimen Aldactone.  On IV Lasix will transition to home regimen oral torsemide tomorrow.   DVT prophylaxis: Lovenox Code Status: Full Family Communication: Updated patient.  No family at bedside Disposition: Likely home however lives at home with her son Shari Scott who is currently hospitalized.  Social work to help determine safe disposition.  Status is: Inpatient Remains inpatient appropriate because: Severity of illness.   Consultants:  None  Procedures:  CT head 05/26/2022 Lower extremity Doppler 05/26/2022   Antimicrobials:  IV aztreonam 05/26/2022>>>> 05/28/2022   Subjective: Laying in bed.  Denies any chest pain.  No shortness of breath.  No abdominal pain.  Denies any dysuria.  Asking about his son Shari Scott who is currently hospitalized at this hospital.  Objective: Vitals:   05/26/22 2111 05/27/22 0054 05/27/22 0507 05/27/22 0936  BP: (!) 146/69 140/66 (!) 132/58 (!) 155/65  Pulse: 88 95 75 75  Resp: Temp: 97.7 F (36.5 C) 98 F (36.7 C) (!) 97.3 F (36.3 C)   TempSrc: Oral Oral Oral   SpO2: 94% 97% 98% 93%  Weight:      Height:  Intake/Output Summary (Last 24 hours) at 05/27/2022 0956 Last data filed at 05/27/2022 0640 Gross per 24 hour  Intake 456.29 ml  Output 750 ml  Net -293.71 ml   Filed Weights   05/25/22 1715 05/26/22 1700  Weight: 59 kg 64.8 kg    Examination:  General  exam: Appears calm and comfortable  Respiratory system: Clear to auscultation. Respiratory effort normal. Cardiovascular system: S1 & S2 heard, RRR. No JVD, murmurs, rubs, gallops or clicks.  Trace lower extremity edema Gastrointestinal system: Abdomen is nondistended, soft and nontender. No organomegaly or masses felt. Normal bowel sounds heard. Central nervous system: Alert and oriented. No focal neurological deficits. Extremities: Symmetric 5 x 5 power. Skin: No rashes, lesions or ulcers Psychiatry: Judgement and insight appear normal. Mood & affect appropriate.     Data Reviewed: I have personally reviewed following labs and imaging studies  CBC: Recent Labs  Lab 05/25/22 1838 05/27/22 0314  WBC 6.4 7.1  NEUTROABS 3.4 3.4  HGB 14.2 14.6  HCT 45.1 43.4  MCV 91.7 89.1  PLT 282 259    Basic Metabolic Panel: Recent Labs  Lab 05/25/22 1838 05/27/22 0314  NA 134* 138  K 4.0 3.8  CL 102 102  CO2 22 25  GLUCOSE 98 102*  BUN 26* 23  CREATININE 0.94 1.01*  CALCIUM 9.3 9.2    GFR: Estimated Creatinine Clearance: 30.1 mL/min (A) (by C-G formula based on SCr of 1.01 mg/dL (H)).  Liver Function Tests: Recent Labs  Lab 05/25/22 1838  AST 17  ALT 14  ALKPHOS 130*  BILITOT 0.6  PROT 7.1  ALBUMIN 3.6    CBG: No results for input(s): "GLUCAP" in the last 168 hours.   No results found for this or any previous visit (from the past 240 hour(s)).       Radiology Studies: VAS [place] LOWER EXTREMITY VENOUS (DVT)  Result Date: 05/26/2022  Lower Venous DVT Study Patient Name:  Shari Scott  Date of Exam:   05/26/2022 Medical Rec #: [id number]       Accession #:    [id number] Date of Birth: 1926/11/03        Patient Gender: F Patient Age:   86 years Exam Location:  [facility] Procedure:      VAS [place] LOWER EXTREMITY VENOUS (DVT) Referring Phys: Ulyess Blossom RATHORE --------------------------------------------------------------------------------  Indications: Swelling.  Risk  Factors: UTI. Limitations: Patient refused compression maneuver secondary to pain. Comparison Study: Prior negative left LEV done 06/06/2015 Performing Technologist: Sherren Kerns RVS  Examination Guidelines: A complete evaluation includes B-mode imaging, spectral Doppler, color Doppler, and power Doppler as needed of all accessible portions of each vessel. Bilateral testing is considered an integral part of a complete examination. Limited examinations for reoccurring indications may be performed as noted. The reflux portion of the exam is performed with the patient in reverse Trendelenburg.  +---------+---------------+---------+-----------+----------+-------------------+ RIGHT    CompressibilityPhasicitySpontaneityPropertiesThrombus Aging      +---------+---------------+---------+-----------+----------+-------------------+ CFV      Full           Yes      No                                       +---------+---------------+---------+-----------+----------+-------------------+ FV Prox                 Yes      No  patent by color and                                                       Doppler             +---------+---------------+---------+-----------+----------+-------------------+ FV Mid                  Yes      No                   patent by color and                                                       Doppler             +---------+---------------+---------+-----------+----------+-------------------+ FV Distal               Yes      No                   patent by color and                                                       Doppler             +---------+---------------+---------+-----------+----------+-------------------+ PFV                     Yes      Yes                  patent by color and                                                       Doppler              +---------+---------------+---------+-----------+----------+-------------------+ POP                     Yes      No                   patent by color and                                                       Doppler             +---------+---------------+---------+-----------+----------+-------------------+ PTV      Full                                                             +---------+---------------+---------+-----------+----------+-------------------+  PERO     Full                                                             +---------+---------------+---------+-----------+----------+-------------------+   +---------+---------------+---------+-----------+----------+-------------------+ LEFT     CompressibilityPhasicitySpontaneityPropertiesThrombus Aging      +---------+---------------+---------+-----------+----------+-------------------+ CFV                     Yes      Yes                  patent by color and                                                       Doppler             +---------+---------------+---------+-----------+----------+-------------------+ FV Prox                 Yes      Yes                  patent by color and                                                       Doppler             +---------+---------------+---------+-----------+----------+-------------------+ FV Mid                  Yes      Yes                  patent by color and                                                       Doppler             +---------+---------------+---------+-----------+----------+-------------------+ FV Distal               Yes      No                   patent by color and                                                       Doppler             +---------+---------------+---------+-----------+----------+-------------------+ PFV                     Yes      Yes                  patent by color and  Doppler             +---------+---------------+---------+-----------+----------+-------------------+ POP                     Yes      Yes                  patent by color and                                                       Doppler             +---------+---------------+---------+-----------+----------+-------------------+ PTV      Full                                                             +---------+---------------+---------+-----------+----------+-------------------+ PERO     Full                                                             +---------+---------------+---------+-----------+----------+-------------------+    Summary: RIGHT: - There is no evidence of deep vein thrombosis in the lower extremity. However, portions of this examination were limited- see technologist comments above.  LEFT: - There is no evidence of deep vein thrombosis in the lower extremity. However, portions of this examination were limited- see technologist comments above.  *See table(s) above for measurements and observations.    Preliminary    CT Head Wo Contrast  Result Date: 05/26/2022 CLINICAL DATA:  Facial trauma, penetrating EXAM: CT HEAD WITHOUT CONTRAST TECHNIQUE: Contiguous axial images were obtained from the base of the skull through the vertex without intravenous contrast. RADIATION DOSE REDUCTION: This exam was performed according to the departmental dose-optimization program which includes automated exposure control, adjustment of the mA and/or kV according to patient size and/or use of iterative reconstruction technique. COMPARISON:  12/02/2021 FINDINGS: Brain: There is atrophy and chronic small vessel disease changes. No acute intracranial abnormality. Specifically, no hemorrhage, hydrocephalus, mass lesion, acute infarction, or significant intracranial injury. Vascular: No hyperdense vessel or unexpected calcification.  Skull: No acute calvarial abnormality. Sinuses/Orbits: No acute findings Other: None IMPRESSION: Atrophy, chronic microvascular disease. No acute intracranial abnormality. Electronically Signed   By: Charlett Nose M.D.   On: 05/26/2022 03:12   DG Chest 2 View  Result Date: 05/26/2022 CLINICAL DATA:  Altered mental status. EXAM: CHEST - 2 VIEW COMPARISON:  Chest radiograph dated 12/03/2021. FINDINGS: Left lung base linear atelectasis/scarring. No focal consolidation, pleural effusion, or pneumothorax. The cardiac silhouette is within normal limits. Atherosclerotic calcification of the aorta. Median sternotomy wires and CABG vascular clips. Degenerative changes of the spine. No acute osseous pathology. IMPRESSION: No active cardiopulmonary disease. Electronically Signed   By: Elgie Collard M.D.   On: 05/26/2022 00:50        Scheduled Meds:  atorvastatin  10 mg Oral Daily   donepezil  10 mg Oral QHS   enoxaparin (LOVENOX) injection  30 mg Subcutaneous Q24H  furosemide  40 mg Intravenous Daily   metoprolol succinate  25 mg Oral Daily   pantoprazole  40 mg Oral Daily   spironolactone  25 mg Oral Daily   Continuous Infusions:  sodium chloride Stopped (05/27/22 0640)   aztreonam Stopped (05/27/22 0640)     LOS: 1 day    Time spent: 35 minutes    Ramiro Harvestaniel Jariyah Hackley, MD [facility]   To contact the attending provider between 7A-7P or the covering provider during after hours 7P-7A, please log into the web site [email or web address] and access using universal [place] password for that web site. If you do not have the password, please call the hospital operator.  05/27/2022, 9:56 AM

## 2022-05-27 NOTE — Progress Notes (Signed)
Initial Nutrition Assessment  DOCUMENTATION CODES:   Not applicable  INTERVENTION:  Liberalize diet from a heart healthy to a regular diet to provide widest variety of menu options to enhance nutritional adequacy Chopped meats with meals Ensure Enlive po BID, each supplement provides 350 kcal and 20 grams of protein. Feeding with assistance MVI with minerals daily  NUTRITION DIAGNOSIS:   Increased nutrient needs related to acute illness as evidenced by estimated needs.  GOAL:   Patient will meet greater than or equal to 90% of their needs  MONITOR:   PO intake, Supplement acceptance, Labs, Diet advancement, Weight trends, Skin  REASON FOR ASSESSMENT:   Malnutrition Screening Tool    ASSESSMENT:   Pt admitted from home with AMS, found to have UTI. PMH significant for dementia, HTN, HLD, chronic diastolic CHF and anxiety.  Pt resting in chair during assessment. She awoke to shoulder tap. No family present at bedside to assist with nutrition hx as pt has dementia. Pt reports feeling hungry. Lunch tray was delivered which included chicken noodle soup. She requested chopped meats as she has her top dentures but not her bottom plate.   Spoke with RN who states that when she has feeding assistance, she typically eats well and reports that she ate most of her breakfast this morning.   Meal completions: 06/11: 25%-dinner 06/12: 75%-breakfast, 50%-lunch  Limited documentation of wt hx within the last year. Current wt 64.8 kg.   Medications: lasix 40mg  daily, protonix IV drips: NaCl   Labs:  Cr 1.01, GFR 52  UOP: x24 hours  I/O's: - since admission  NUTRITION - FOCUSED PHYSICAL EXAM:  Flowsheet Row Most Recent Value  Orbital Region Moderate depletion  Upper Arm Region No depletion  Thoracic and Lumbar Region No depletion  Buccal Region No depletion  Temple Region Mild depletion  Clavicle Bone Region Mild depletion  Clavicle and Acromion Bone Region No  depletion  Scapular Bone Region No depletion  Dorsal Hand No depletion  Patellar Region No depletion  Anterior Thigh Region Mild depletion  Posterior Calf Region No depletion  Edema (RD Assessment) None  Hair Reviewed  Eyes Reviewed  Mouth Other (Comment)  [poor dentition]  Skin Reviewed  Nails Reviewed       Diet Order:   Diet Order             Diet regular Room service appropriate? Yes; Fluid consistency: Thin  Diet effective now                   EDUCATION NEEDS:   No education needs have been identified at this time  Skin:  Skin Assessment: Skin Integrity Issues: Skin Integrity Issues:: Other (Comment) Other: MASD L hip  Last BM:  6/11 (type 6)  Height:   Ht Readings from Last 1 Encounters:  05/26/22 5\' 2"  (1.575 m)    Weight:   Wt Readings from Last 1 Encounters:  05/26/22 64.8 kg   BMI:  Body mass index is 26.13 kg/m.  Estimated Nutritional Needs:   Kcal:  1300-1500  Protein:  75-90g  Fluid:  >/=1.5L  , RDN, LDN Clinical Nutrition

## 2022-05-27 NOTE — Evaluation (Addendum)
Occupational Therapy Evaluation Patient Details Name: Shari Scott MRN: [id number] DOB: 1926-05-08 Today's Date: 05/27/2022   History of Present Illness Shari Scott is a 88 y.o. presented to the ED for evaluation of altered mental status, UTI; Reportedly the patient's son is admitted to the hospital and when she went to visit him, it was noted that she was altered and was sent to the ED to be evaluated; female with medical history significant of dementia, hypertension, hyperlipidemia, chronic diastolic CHF, anxiety   Clinical Impression   Pt questionable historian, however reports independence at baseline for ADLs and functional mobility, lives with son who is out of the house at work most of the day. Pt requiring min-mod A for ADLs and min A for transfers with RW. Pt with decreased cognition, requiring frequent cues to perform ADL tasks and task sequencing. Pt presenting with impairments listed below, will follow acutely. Recommend SNF at d/c.      Recommendations for follow up therapy are one component of a multi-disciplinary discharge planning process, led by the attending physician.  Recommendations may be updated based on patient status, additional functional criteria and insurance authorization.   Follow Up Recommendations  Skilled nursing-short term rehab (<3 hours/day)    Assistance Recommended at Discharge Intermittent Supervision/Assistance  Patient can return home with the following A little help with walking and/or transfers;A lot of help with bathing/dressing/bathroom;Assistance with cooking/housework;Direct supervision/assist for financial management;Direct supervision/assist for medications management;Assist for transportation;Help with stairs or ramp for entrance    Functional Status Assessment  Patient has had a recent decline in their functional status and demonstrates the ability to make significant improvements in function in a reasonable and predictable amount of time.   Equipment Recommendations  None recommended by OT;Other (comment) (defer to next venue of care)    Recommendations for Other Services PT consult     Precautions / Restrictions Precautions Precautions: Fall Precaution Comments: Urinary urgency/frequency      Mobility Bed Mobility               General bed mobility comments: up in chair upon arrival    Transfers Overall transfer level: Needs assistance Equipment used: Rolling walker (2 wheels) Transfers: Sit to/from Stand Sit to Stand: Min assist           General transfer comment: cues for hand placement on RW, safety with navigating room space      Balance Overall balance assessment: Needs assistance Sitting-balance support: Feet supported, Single extremity supported, Bilateral upper extremity supported Sitting balance-Leahy Scale: Fair     Standing balance support: Reliant on assistive device for balance, During functional activity Standing balance-Leahy Scale: Fair Standing balance comment: reliant on external support                           ADL either performed or assessed with clinical judgement   ADL Overall ADL's : Needs assistance/impaired Eating/Feeding: Set up;Sitting   Grooming: Set up;Sitting   Upper Body Bathing: Moderate assistance;Sitting   Lower Body Bathing: Moderate assistance;Sitting/lateral leans;Sit to/from stand   Upper Body Dressing : Moderate assistance;Sitting   Lower Body Dressing: Moderate assistance;Sitting/lateral leans;Sit to/from stand Lower Body Dressing Details (indicate cue type and reason): to don socks/doffs shoes Toilet Transfer: Minimal assistance;Rolling walker (2 wheels);Regular Toilet;Ambulation   Toileting- Clothing Manipulation and Hygiene: Supervision/safety;Sitting/lateral lean;Sit to/from stand Toileting - Clothing Manipulation Details (indicate cue type and reason): for pericare     Functional mobility during ADLs:  Minimal  assistance;Rolling walker (2 wheels);Cueing for safety;Cueing for sequencing       Vision Baseline Vision/History: 1 Wears glasses Ability to See in Adequate Light: 0 Adequate Patient Visual Report: No change from baseline Vision Assessment?: No apparent visual deficits     Perception     Praxis      Pertinent Vitals/Pain Pain Assessment Pain Assessment: No/denies pain     Hand Dominance Right   Extremity/Trunk Assessment Upper Extremity Assessment Upper Extremity Assessment: Generalized weakness   Lower Extremity Assessment Lower Extremity Assessment: Defer to PT evaluation   Cervical / Trunk Assessment Cervical / Trunk Assessment: Kyphotic   Communication Communication Communication: No difficulties   Cognition Arousal/Alertness: Awake/alert Behavior During Therapy: WFL for tasks assessed/performed, Anxious Overall Cognitive Status: Impaired/Different from baseline Area of Impairment: Memory, Awareness                     Memory: Decreased short-term memory (dementia hx)     Awareness: Emergent   General Comments: pt requiring constant reiteration/cues to perform steps of toileting task     General Comments  VSS on RA    Exercises     Shoulder Instructions      Home Living Family/patient expects to be discharged to:: Private residence Living Arrangements: Children Available Help at Discharge: Family;Available PRN/intermittently (son) Type of Home: House Home Access: Stairs to enter [employer] of Steps: 3 Entrance Stairs-Rails: Right Home Layout: One level     Bathroom Shower/Tub: Tub/shower unit         Home Equipment: Conservation officer, nature (2 wheels);Shower seat   Additional Comments: Not an entirely reliable historian, but it is reasonable that her son works or is often out and about      Prior Functioning/Environment Prior Level of Function : Patient poor historian/Family not available             Mobility Comments:  was not using AD, aware that she now will need RW ADLs Comments: reports independence with ADLs        OT Problem List: Decreased strength;Decreased range of motion;Decreased activity tolerance;Impaired balance (sitting and/or standing);Decreased cognition;Decreased coordination      OT Treatment/Interventions: Self-care/ADL training;Therapeutic exercise;DME and/or AE instruction;Therapeutic activities;Visual/perceptual remediation/compensation;Patient/family education;Balance training;Cognitive remediation/compensation    OT Goals(Current goals can be found in the care plan section) Acute Rehab OT Goals Patient Stated Goal: none stated OT Goal Formulation: With patient Time For Goal Achievement: 06/10/22 Potential to Achieve Goals: Good ADL Goals Pt Will Perform Upper Body Dressing: with supervision;sitting Pt Will Perform Lower Body Dressing: with min assist;sitting/lateral leans;sit to/from stand Pt Will Transfer to Toilet: with supervision;ambulating;regular height toilet  OT Frequency: Min 2X/week    Co-evaluation              AM-PAC OT "6 Clicks" Daily Activity     Outcome Measure Help from another person eating meals?: A Little Help from another person taking care of personal grooming?: A Little Help from another person toileting, which includes using toliet, bedpan, or urinal?: A Lot Help from another person bathing (including washing, rinsing, drying)?: A Lot Help from another person to put on and taking off regular upper body clothing?: A Lot Help from another person to put on and taking off regular lower body clothing?: A Lot 6 Click Score: 14   End of Session Equipment Utilized During Treatment: Gait belt;Rolling walker (2 wheels) Nurse Communication: Mobility status (pt needs bed change)  Activity Tolerance: Patient tolerated treatment well Patient  left: with call bell/phone within reach;in chair;with chair alarm set  OT Visit Diagnosis: Unsteadiness on feet  (R26.81);Other abnormalities of gait and mobility (R26.89);Muscle weakness (generalized) (M62.81);Other symptoms and signs involving cognitive function                Time: 1340-1401 OT Time Calculation (min): 21 min Charges:  OT General Charges $OT Visit: 1 Visit OT Evaluation $OT Eval Low Complexity: 1 Low  Lynnda Child, OTD, OTR/L Acute Rehab [phone]) [phone] - [place] 05/27/2022, 5:32 PM

## 2022-05-27 NOTE — Evaluation (Signed)
Physical Therapy Evaluation Patient Details Name: Shari Scott MRN: [id number] DOB: 11-Feb-1926 Today's Date: 05/27/2022  History of Present Illness  Shari Scott is a 88 y.o. presented to the ED for evaluation of altered mental status, UTI; Reportedly the patient's son is admitted to the hospital and when she went to visit him, it was noted that she was altered and was sent to the ED to be evaluated; female with medical history significant of dementia, hypertension, hyperlipidemia, chronic diastolic CHF, anxiety  Clinical Impression   Pt admitted with above diagnosis. Lives at home with her son, in a single-level home with a few steps to enter; Prior to admission, reports she was able to manage independently, no need for assist with ADLs, and that she has a RW, but doesn't need to use it with walking; reports her son works, and is alone at home quite a lot; Will need to verify all of this, but it is reasonable that her son is out of the home from time to time; Presents to PT with generalized weakness, cognitive deficits/memory deficits concerning for pt's safety at home alone; Family has also voiced concern about ability to meet pt's needs, and would like to consider SNF; Agree with SNF for rehab to maximize independence and safety with mobility and ADLs -- and a post-acute rehab stay will give family time to look into more sustainable living options like ALF/Memory Care, or more in-home assist for pt;  Pt currently with functional limitations due to the deficits listed below (see PT Problem List). Pt will benefit from skilled PT to increase their independence and safety with mobility to allow discharge to the venue listed below.          Recommendations for follow up therapy are one component of a multi-disciplinary discharge planning process, led by the attending physician.  Recommendations may be updated based on patient status, additional functional criteria and insurance authorization.  Follow  Up Recommendations Skilled nursing-short term rehab (<3 hours/day)    Assistance Recommended at Discharge Frequent or constant Supervision/Assistance  Patient can return home with the following  Help with stairs or ramp for entrance;Direct supervision/assist for medications management;Direct supervision/assist for financial management;Assist for transportation;Assistance with cooking/housework;A lot of help with bathing/dressing/bathroom;A little help with walking and/or transfers    Equipment Recommendations BSC/3in1;Rolling walker (2 wheels) (May already have)  Recommendations for Other Services  OT consult    Functional Status Assessment Patient has had a recent decline in their functional status and demonstrates the ability to make significant improvements in function in a reasonable and predictable amount of time.     Precautions / Restrictions Precautions Precautions: Fall Precaution Comments: Urinary urgency/frequency      Mobility  Bed Mobility Overal bed mobility: Needs Assistance Bed Mobility: Supine to Sit     Supine to sit: Min assist     General bed mobility comments: Min handheld assist to pull to sit and cues to complete the task of sitting up and scooting to side enough to touch feet to the floor    Transfers Overall transfer level: Needs assistance Equipment used: Rolling walker (2 wheels) Transfers: Sit to/from Stand Sit to Stand: Min assist           General transfer comment: Cues for hand placement; min assist to steady    Ambulation/Gait Ambulation/Gait assistance: Min assist, Min guard Gait Distance (Feet): 45 Feet (including a stop at [facility], then standing stop at sink) Assistive device: Rolling walker (2 wheels) Gait Pattern/deviations:  Step-through pattern, Decreased step length - right, Decreased step length - left, Trunk flexed Gait velocity: slowed     General Gait Details: Cues to self-monitor for activity tolerance ; frequent reminders  for pathfinding and plan to get to recliner by th ewindow for lunch  Stairs            Wheelchair Mobility    Modified Rankin (Stroke Patients Only)       Balance Overall balance assessment: Needs assistance Sitting-balance support: Feet supported, Single extremity supported, Bilateral upper extremity supported Sitting balance-Leahy Scale: Fair (approaching Good)       Standing balance-Leahy Scale: Fair                               Pertinent Vitals/Pain Pain Assessment Pain Assessment: No/denies pain    Home Living Family/patient expects to be discharged to:: Private residence Living Arrangements: Children Available Help at Discharge: Family (Pt reports son is often out of the home when he is working) Type of Home: House Home Access: Stairs to enter Entrance Stairs-Rails: Right Entrance Stairs-Number of Steps: 3   Home Layout: One level Home Equipment: Conservation officer, nature (2 wheels);Shower seat Additional Comments: Not an entirely reliable historian, but it is reasonable that her son works or is often out and about    Prior Function Prior Level of Function : Patient poor historian/Family not available             Mobility Comments: Reports she has a RW, but doesn't like to use it; no falls -- this will need to be verified ADLs Comments: Reports no difficulty with ADLs -- will need to be verified     Hand Dominance   Dominant Hand: Right    Extremity/Trunk Assessment   Upper Extremity Assessment Upper Extremity Assessment: Defer to OT evaluation (Noted some finger extension restrictions bilaterally)    Lower Extremity Assessment Lower Extremity Assessment: Generalized weakness    Cervical / Trunk Assessment Cervical / Trunk Assessment: Kyphotic  Communication   Communication: No difficulties  Cognition Arousal/Alertness: Awake/alert Behavior During Therapy: WFL for tasks assessed/performed, Anxious Overall Cognitive Status:  Impaired/Different from baseline Area of Impairment: Memory, Awareness                     Memory: Decreased short-term memory (history of dementia)     Awareness: Emergent   General Comments: Noted pt initially not wanting to walk with RW, however, once up, she naturally accepted bilateral handheld assist and then UE support on RW; when offered for her to walk a little bit without the RW, she (very appropriately) indicated she wants to use it for walking; got a bit anxious re: knowing the location of her upper dentures; needing to repeat where they were numerous times during session        General Comments General comments (skin integrity, edema, etc.): Incontinent of urine initially during walk; assisted with gown change    Exercises     Assessment/Plan    PT Assessment Patient needs continued PT services  PT Problem List Decreased strength;Decreased range of motion;Decreased activity tolerance;Decreased balance;Decreased mobility;Decreased coordination;Decreased cognition;Decreased knowledge of use of DME;Decreased safety awareness;Decreased knowledge of precautions       PT Treatment Interventions DME instruction;Gait training;Stair training;Functional mobility training;Therapeutic activities;Therapeutic exercise;Balance training;Neuromuscular re-education;Cognitive remediation;Patient/family education    PT Goals (Current goals can be found in the Care Plan section)  Acute Rehab PT Goals Patient Stated  Goal: Wants to know when she "can go" PT Goal Formulation: Patient unable to participate in goal setting Time For Goal Achievement: 06/10/22 Potential to Achieve Goals: Good    Frequency Min 2X/week     Co-evaluation               AM-PAC PT "6 Clicks" Mobility  Outcome Measure Help needed turning from your back to your side while in a flat bed without using bedrails?: A Little Help needed moving from lying on your back to sitting on the side of a flat bed  without using bedrails?: A Little Help needed moving to and from a bed to a chair (including a wheelchair)?: A Little Help needed standing up from a chair using your arms (e.g., wheelchair or bedside chair)?: A Little Help needed to walk in hospital room?: A Little Help needed climbing 3-5 steps with a railing? : A Lot 6 Click Score: 17    End of Session Equipment Utilized During Treatment: Gait belt Activity Tolerance: Patient tolerated treatment well Patient left: in chair;with call bell/phone within reach;with chair alarm set Nurse Communication: Mobility status PT Visit Diagnosis: Unsteadiness on feet (R26.81);Muscle weakness (generalized) (M62.81);Other symptoms and signs involving the nervous system (R29.898)    Time: [id number] PT Time Calculation (min) (ACUTE ONLY): 39 min   Charges:   PT Evaluation $PT Eval Moderate Complexity: 1 Mod PT Treatments $Gait Training: 8-22 mins $Therapeutic Activity: 8-22 mins        Roney Marion, PT  Acute Rehabilitation Services Office [phone]   [place] 05/27/2022, 1:29 PM

## 2022-05-28 DIAGNOSIS — N390 Urinary tract infection, site not specified: Secondary | ICD-10-CM | POA: Diagnosis not present

## 2022-05-28 DIAGNOSIS — R600 Localized edema: Secondary | ICD-10-CM | POA: Diagnosis not present

## 2022-05-28 DIAGNOSIS — G9341 Metabolic encephalopathy: Secondary | ICD-10-CM | POA: Diagnosis not present

## 2022-05-28 DIAGNOSIS — N3000 Acute cystitis without hematuria: Secondary | ICD-10-CM | POA: Diagnosis not present

## 2022-05-28 LAB — CBC WITH DIFFERENTIAL/PLATELET
Abs Immature Granulocytes: 0.07 10*3/uL (ref 0.00–0.07)
Basophils Absolute: 0 10*3/uL (ref 0.0–0.1)
Basophils Relative: 1 %
Eosinophils Absolute: 0.2 10*3/uL (ref 0.0–0.5)
Eosinophils Relative: 3 %
HCT: 41.9 % (ref 36.0–46.0)
Hemoglobin: 13.9 g/dL (ref 12.0–15.0)
Immature Granulocytes: 1 %
Lymphocytes Relative: 33 %
Lymphs Abs: 2.6 10*3/uL (ref 0.7–4.0)
MCH: 29.4 pg (ref 26.0–34.0)
MCHC: 33.2 g/dL (ref 30.0–36.0)
MCV: 88.8 fL (ref 80.0–100.0)
Monocytes Absolute: 0.9 10*3/uL (ref 0.1–1.0)
Monocytes Relative: 11 %
Neutro Abs: 4 10*3/uL (ref 1.7–7.7)
Neutrophils Relative %: 51 %
Platelets: 261 10*3/uL (ref 150–400)
RBC: 4.72 MIL/uL (ref 3.87–5.11)
RDW: 15.1 % (ref 11.5–15.5)
WBC: 7.8 10*3/uL (ref 4.0–10.5)
nRBC: 0 % (ref 0.0–0.2)

## 2022-05-28 LAB — BASIC METABOLIC PANEL
Anion gap: 9 (ref 5–15)
BUN: 24 mg/dL — ABNORMAL HIGH (ref 8–23)
CO2: 27 mmol/L (ref 22–32)
Calcium: 9 mg/dL (ref 8.9–10.3)
Chloride: 99 mmol/L (ref 98–111)
Creatinine, Ser: 1.11 mg/dL — ABNORMAL HIGH (ref 0.44–1.00)
GFR, Estimated: 46 mL/min — ABNORMAL LOW (ref >60)
Glucose, Bld: 93 mg/dL (ref 70–99)
Potassium: 4.2 mmol/L (ref 3.5–5.1)
Sodium: 135 mmol/L (ref 135–145)

## 2022-05-28 LAB — URINE CULTURE

## 2022-05-28 MED ORDER — ENOXAPARIN SODIUM 30 MG/0.3ML IJ SOSY
30.0000 mg | PREFILLED_SYRINGE | INTRAMUSCULAR | Status: DC
  Administered 2022-05-28: 30 mg via SUBCUTANEOUS
  Filled 2022-05-28: qty 0.3

## 2022-05-28 NOTE — Plan of Care (Signed)

## 2022-05-28 NOTE — TOC Initial Note (Addendum)
Transition of Care [facility](TOC) - Initial/Assessment Note    Patient Details  Name: Shari BabinskiMartha T Scott MRN: [id number] Date of Birth: 07/22/1926  Transition of Care [facility](TOC) CM/SW Contact:    Shari BatheeAndra F Shyne Scott, LCSWA Phone Number: 05/28/2022, 11:17 AM  Clinical Narrative:                 CSW received consult for possible SNF placement at time of discharge. CSW spoke with patient at bedside. Patient ris aware that her son is in the hospital and will not be home to care for patient at discharge and per patient "I can't stay home by myself".  Patient expressed understanding of PT recommendation and is agreeable to SNF placement at time of discharge. Patient did not report a preference.  CSW discussed insurance authorization process and will provide Medicare SNF ratings list. Patient has received 3 COVID vaccines. CSW will send out referrals for review.  No further questions reported at this time.   CSW called the patient's son, Shari Scott, as the other son Shari Fus(Thomas) is in the hospital to discuss discharge planning.  There was no answer.  CSW left a VM requesting a returned call.    CSW also attempted to call the patient's son Shari Scott @ [phone], the number received from RN and received a message that the call could not be completed.    11:30-  CSW received a returned call from patient's son, Shari Scott, [facility] [place] [facility].  He is in agreement with the patient going to a SNF.  He did not have an agency preference.   Skilled Nursing Rehab Facilities-   ShinProtection.co.ukhttps://www.medicare.gov/care-compare/   Ratings out of 5 possible   Name Address  Phone # Quality Care Staffing Health Inspection Overall  [facility] [address], [place] [phone] 4 5 2 3   [facility]  [address], [place] [phone] 3 2 5 5   [facility] [address], [place] [phone] 3 1 1 1   [facility] [address], [place] [phone] 3 2 4 4   [facility] [address], [place] [phone] 1 1 2 1   [facility] [phone] N. [address], [place] [phone] 2 1 4 3   [facility] [address], [place] [phone] 5 2 3 4   [facility] [address], [place] [phone] 5 2 2 3   [address] ([place]) [address], [place] [phone] 5 1 2 2   [facility] [phone], [place] [phone] 4 1 2 1   [facility] [address], [facility] [phone] 4 1 2 1   [facility] [address], [place] [phone] 4 1 1 1   [facility] [address], [place] [phone] 3 2 4 4           [facility] [address], [place] [phone]      [facility] [address], [place] [phone] 4 2 3 3   [facility] [place] [address], [place] [phone] 4 1 5 4   [facility], [place] [address] [place] [address]1119, [place] [phone] 2 1 1 1   [facility] [address], [place] [phone] 2 1 3 2           [address] (no [facility]) [address], [place] [phone] 4 5 5 5   [facility] (No Humana) [address] [place], [place] [phone] 3 1 4 3   [facility] (No UHC) [address], [place] [phone] 5  5 5  [facility] [address], [employer] [phone] 3 2 4 4   [facility] [phone] N. [address], [place] [address] [place] 1 1 2 1   [facility] [address], [place] 2 1 1 1   [place] [address] [place] 5 2 4 5   [facility] [address] [phone] 3 1 1 1   [facility] [address] [place], [phone] 2 1 2 1           [facility] [address] [place] [phone] 1 1 1 1   [place] [address],  [phone] 2 4 2 2   [facility]  [address], [phone] 5 2 3 4   [facility] [place] [address], [place] [phone] 2 1 1 1    [facility] (No Humana) [address] 2 1 3 2   [facility] [address] [phone] 3 1 1 1           [facility] [address] [place], [address] [place] 5 4 5 5   [facility])  [address] Rosalita Levan 2 2 3 3   [facility]) [address], [facility] [address] 3 2 4 4   [facility] [address] [phone] 4 3 4 4   [address] [place], [place] [phone] 3 3 1 1   [facility]) [address] [place] [phone] 2 2 4 4      Expected Discharge Plan: Skilled Nursing Facility Barriers to Discharge: SNF Pending bed offer, Insurance Authorization, Continued Medical Work up   Patient Goals and CMS Choice Patient states their goals for this hospitalization and ongoing recovery are:: To go home with son CMS Medicare.gov Compare Post Acute Care list provided to:: Patient Choice offered to / list presented to : Patient  Expected Discharge Plan and Services Expected Discharge Plan: Skilled Nursing Facility       Living arrangements for the past 2 months: Single Family Home                                      Prior Living Arrangements/Services Living arrangements for the past 2 months: Single Family Home Lives with:: Adult Children Patient language and need for interpreter reviewed:: Yes Do you feel safe going back to the place where you live?: Yes      Need for Family Participation in Patient Care: Yes (Comment) Care giver support system in place?: No (comment)   Criminal Activity/Legal Involvement Pertinent to Current Situation/Hospitalization: No - Comment as needed  Activities of Daily Living Home Assistive Devices/Equipment: Other (Comment) (unknown, AMS) ADL Screening (condition at time of admission) Patient's cognitive ability adequate to safely complete daily activities?: No Is the patient  deaf or have difficulty hearing?: No Does the patient have difficulty seeing, even when wearing glasses/contacts?: No Does the patient have difficulty concentrating, remembering, or making decisions?: Yes Patient able to express need for assistance with ADLs?: No Does the patient have difficulty dressing or bathing?: Yes Independently performs ADLs?: No Communication: Independent Dressing (OT): Needs assistance Grooming: Needs assistance Feeding: Independent Bathing: Needs assistance Toileting: Needs assistance In/Out Bed: Needs assistance Walks in Home: Independent Does the patient have difficulty walking or climbing stairs?: No Weakness of Legs: None Weakness of Arms/Hands: None  Permission Sought/Granted Permission sought to share  information with : Other (comment), Family Supports (CSW) Permission granted to share information with : Yes, Verbal Permission Granted     Permission granted to share info w AGENCY: SNFs        Emotional Assessment Appearance:: Appears stated age Attitude/Demeanor/Rapport: Complaining Affect (typically observed): Stable Orientation: : Oriented to Self, Oriented to Place Alcohol / Substance Use: Not Applicable Psych Involvement: No (comment)  Admission diagnosis:  Delirium [R41.0] UTI (urinary tract infection) [N39.0] Acute cystitis without hematuria [N30.00] Patient Active Problem List   Diagnosis Date Noted   UTI (urinary tract infection) 05/26/2022   Acute metabolic encephalopathy 05/26/2022   Chronic diastolic CHF (congestive heart failure) (HCC) 05/26/2022   Bilateral lower extremity edema 05/26/2022   Dementia (HCC) 05/26/2022   Essential hypertension 05/26/2022   Hyperlipidemia 05/26/2022   Closed nondisplaced type II dens fracture (HCC) 12/11/2021   PCP:  Default, Provider, MD Pharmacy:   [facility] [phone] - [place], [place] - [address] [place] [address] Phone: [phone]  Fax: [phone]  [id number] - [place] - [address] AT [facility] [address] [place]  [address] Phone: [phone] Fax: [phone]     Social Determinants of Health (SDOH) Interventions    Readmission Risk Interventions     No data to display

## 2022-05-28 NOTE — Progress Notes (Signed)
PROGRESS NOTE    Shari Scott  ZOX:[id number] DOB: 12/19/1925 DOA: 05/25/2022 PCP: Default, Provider, MD    Chief Complaint  Patient presents with   Altered Mental Status    Brief Narrative:  Patient pleasant 88 year old lady history of dementia, hypertension, hyperlipidemia, chronic diastolic CHF, anxiety presented to the ED for evaluation of altered mental status.  It is noted the patient's son is admitted to the hospital and when she went to visit them she was noted to be altered and sent to the ED.  Work-up concerning for UTI.  Head CT negative.  Patient placed on empiric IV antibiotics while urine cultures pending.   Assessment & Plan:   Principal Problem:   UTI (urinary tract infection) Active Problems:   Acute metabolic encephalopathy   Chronic diastolic CHF (congestive heart failure) (HCC)   Bilateral lower extremity edema   Dementia (HCC)   Essential hypertension   Hyperlipidemia  #1 probable UTI -Patient presented with altered mental status.  Urinalysis concerning for UTI. -Patient afebrile, normal white count. -Urine cultures with multiple species. -IV aztreonam day 3/3. -Supportive care.  2.  Acute metabolic encephalopathy -Felt likely secondary to UTI in the setting of dementia,?  Polypharmacy. -CT head negative. -Urinalysis concerning for UTI, urine cultures with multiple species.  -Check a TSH, ammonia level, folic acid, vitamin B12 levels -Patient likely at baseline.   -Antibiotic day 3/3.    3.  Chronic diastolic CHF -2D echo from July 2018 EF of 55 to 60%, mild diastolic dysfunction, mild aortic stenosis and mild MR. -Per admitting physician patient noted to have significant peripheral edema but BNP was within normal limits.  Chest x-ray with no acute pulmonary edema. -Patient was placed on Lasix 40 mg IV daily and has been transition to home regimen torsemide. -Continue Lipitor, Toprol-XL, Aldactone.  4.  Bilateral lower extremity edema -2D echo  from July 2018 with EF of 55 to 60%, mild diastolic dysfunction, mild aortic stenosis, mild MR. -Chest x-ray with no pulmonary edema noted. -Lower extremity Dopplers negative for DVT. -Patient placed on IV Lasix and has been transition back to home dose oral torsemide.   -Patient with no pitting edema.   -Continue home regimen Aldactone.   -Outpatient follow-up with PCP.    5.  Dementia -Stable. -Continue home regimen Aricept. -Seroquel nightly as needed agitation.  6.  Hyperlipidemia -Continue statin.  7.  Hypertension Continue Toprol-XL, Aldactone, torsemide.   DVT prophylaxis: Lovenox Code Status: Full Family Communication: Updated patient.  No family at bedside Disposition: SNF versus home with home health.    Status is: Inpatient Remains inpatient appropriate because: Severity of illness.   Consultants:  None  Procedures:  CT head 05/26/2022 Lower extremity Doppler 05/26/2022   Antimicrobials:  IV aztreonam 05/26/2022>>>> 05/28/2022   Subjective: Sitting up in bed.  Complaining of some neck pain.  No chest pain.  No shortness of breath.  No abdominal pain.  No dysuria.  Asking about his son and hoping to go home.  Little hesitant when SNF is brought up.    Objective: Vitals:   05/27/22 1701 05/27/22 2037 05/28/22 0513 05/28/22 0932  BP: (!) 148/86 124/65 127/80 (!) 138/58  Pulse: 80 89 86 75  Resp: 19 19 18 17   Temp: 98.2 F (36.8 C) 98 F (36.7 C) 97.6 F (36.4 C) 98.1 F (36.7 C)  TempSrc:  Oral Oral   SpO2: 98% 96% 97% 100%  Weight:      Height:  Intake/Output Summary (Last 24 hours) at 05/28/2022 0945 Last data filed at 05/28/2022 0600 Gross per 24 hour  Intake 589.84 ml  Output 250 ml  Net 339.84 ml    Filed Weights   05/25/22 1715 05/26/22 1700  Weight: 59 kg 64.8 kg    Examination:  General exam: NAD Respiratory system: CTA B.  No wheezes, no crackles, no rhonchi.  Normal respiratory effort.   Cardiovascular system: RRR no  murmurs rubs or gallops.  No JVD.  No lower extremity edema.  Gastrointestinal system: Abdomen is nondistended, soft and nontender. No organomegaly or masses felt. Normal bowel sounds heard. Central nervous system: Alert and oriented. No focal neurological deficits. Extremities: Symmetric 5 x 5 power. Skin: No rashes, lesions or ulcers Psychiatry: Judgement and insight appear normal. Mood & affect appropriate.     Data Reviewed: I have personally reviewed following labs and imaging studies  CBC: Recent Labs  Lab 05/25/22 1838 05/27/22 0314 05/28/22 0332  WBC 6.4 7.1 7.8  NEUTROABS 3.4 3.4 4.0  HGB 14.2 14.6 13.9  HCT 45.1 43.4 41.9  MCV 91.7 89.1 88.8  PLT 282 259 261     Basic Metabolic Panel: Recent Labs  Lab 05/25/22 1838 05/27/22 0314 05/28/22 0332  NA 134* 138 135  K 4.0 3.8 4.2  CL 102 102 99  CO2 GLUCOSE 98 102* 93  BUN 26* 23 24*  CREATININE 0.94 1.01* 1.11*  CALCIUM 9.3 9.2 9.0     GFR: Estimated Creatinine Clearance: 27.4 mL/min (A) (by C-G formula based on SCr of 1.11 mg/dL (H)).  Liver Function Tests: Recent Labs  Lab 05/25/22 1838  AST 17  ALT 14  ALKPHOS 130*  BILITOT 0.6  PROT 7.1  ALBUMIN 3.6     CBG: No results for input(s): "GLUCAP" in the last 168 hours.   Recent Results (from the past 240 hour(s))  Urine Culture     Status: Abnormal   Collection Time: 05/26/22  3:52 AM   Specimen: Urine, Clean Catch  Result Value Ref Range Status   Specimen Description URINE, CLEAN CATCH  Final   Special Requests   Final    NONE Performed at [facility], [address], [place], [place] [address]    Culture MULTIPLE SPECIES PRESENT, SUGGEST RECOLLECTION (A)  Final   Report Status 05/28/2022 FINAL  Final         Radiology Studies: VAS [place] LOWER EXTREMITY VENOUS (DVT)  Result Date: 05/27/2022  Lower Venous DVT Study Patient Name:  Shari Scott  Date of Exam:   05/26/2022 Medical Rec #: [id number]       Accession #:     [id number] Date of Birth: 07-10-1926        Patient Gender: F Patient Age:   51 years Exam Location:  [facility] Procedure:      VAS [place] LOWER EXTREMITY VENOUS (DVT) Referring Phys: Ulyess Blossom RATHORE --------------------------------------------------------------------------------  Indications: Swelling.  Risk Factors: UTI. Limitations: Patient refused compression maneuver secondary to pain. Comparison Study: Prior negative left LEV done 06/06/2015 Performing Technologist: Sherren Kerns RVS  Examination Guidelines: A complete evaluation includes B-mode imaging, spectral Doppler, color Doppler, and power Doppler as needed of all accessible portions of each vessel. Bilateral testing is considered an integral part of a complete examination. Limited examinations for reoccurring indications may be performed as noted. The reflux portion of the exam is performed with the patient in reverse Trendelenburg.  +---------+---------------+---------+-----------+----------+-------------------+ RIGHT  CompressibilityPhasicitySpontaneityPropertiesThrombus Aging      +---------+---------------+---------+-----------+----------+-------------------+ CFV      Full           Yes      No                                       +---------+---------------+---------+-----------+----------+-------------------+ FV Prox                 Yes      No                   patent by color and                                                       Doppler             +---------+---------------+---------+-----------+----------+-------------------+ FV Mid                  Yes      No                   patent by color and                                                       Doppler             +---------+---------------+---------+-----------+----------+-------------------+ FV Distal               Yes      No                   patent by color and                                                        Doppler             +---------+---------------+---------+-----------+----------+-------------------+ PFV                     Yes      Yes                  patent by color and                                                       Doppler             +---------+---------------+---------+-----------+----------+-------------------+ POP                     Yes      No                   patent by color and  Doppler             +---------+---------------+---------+-----------+----------+-------------------+ PTV      Full                                                             +---------+---------------+---------+-----------+----------+-------------------+ PERO     Full                                                             +---------+---------------+---------+-----------+----------+-------------------+   +---------+---------------+---------+-----------+----------+-------------------+ LEFT     CompressibilityPhasicitySpontaneityPropertiesThrombus Aging      +---------+---------------+---------+-----------+----------+-------------------+ CFV                     Yes      Yes                  patent by color and                                                       Doppler             +---------+---------------+---------+-----------+----------+-------------------+ FV Prox                 Yes      Yes                  patent by color and                                                       Doppler             +---------+---------------+---------+-----------+----------+-------------------+ FV Mid                  Yes      Yes                  patent by color and                                                       Doppler             +---------+---------------+---------+-----------+----------+-------------------+ FV Distal               Yes      No                    patent by color and  Doppler             +---------+---------------+---------+-----------+----------+-------------------+ PFV                     Yes      Yes                  patent by color and                                                       Doppler             +---------+---------------+---------+-----------+----------+-------------------+ POP                     Yes      Yes                  patent by color and                                                       Doppler             +---------+---------------+---------+-----------+----------+-------------------+ PTV      Full                                                             +---------+---------------+---------+-----------+----------+-------------------+ PERO     Full                                                             +---------+---------------+---------+-----------+----------+-------------------+     Summary: RIGHT: - There is no evidence of deep vein thrombosis in the lower extremity. However, portions of this examination were limited- see technologist comments above.  LEFT: - There is no evidence of deep vein thrombosis in the lower extremity. However, portions of this examination were limited- see technologist comments above.  *See table(s) above for measurements and observations. Electronically signed by Lemar Livings MD on 05/27/2022 at 6:03:09 PM.    Final         Scheduled Meds:  atorvastatin  10 mg Oral Daily   donepezil  10 mg Oral QHS   enoxaparin (LOVENOX) injection  30 mg Subcutaneous Q24H   feeding supplement  237 mL Oral BID BM   metoprolol succinate  25 mg Oral Daily   multivitamin with minerals  1 tablet Oral Daily   pantoprazole  40 mg Oral Daily   spironolactone  25 mg Oral Daily   torsemide  10 mg Oral Daily   Continuous Infusions:  sodium chloride Stopped (05/27/22 0640)   aztreonam 1 g (05/28/22  0558)     LOS: 2 days    Time spent: 35 minutes    Ramiro Harvest, MD [facility]   To contact the attending provider between 7A-7P or  the covering provider during after hours 7P-7A, please log into the web site [email or web address] and access using universal  password for that web site. If you do not have the password, please call the hospital operator.  05/28/2022, 9:45 AM

## 2022-05-28 NOTE — NC FL2 (Signed)
[place] LEVEL OF CARE SCREENING TOOL     IDENTIFICATION  Patient Name: Shari Scott Birthdate: 03-18-1926 Sex: female Admission Date (Current Location): 05/25/2022  [facility] and [place] Number:  Herbalist and Address:         Provider Number: [id number]  Attending Physician Name and Address:  Eugenie Filler, MD  Relative Name and Phone Number:  [place], [place])   [phone] ;    Current Level of Care: Hospital Recommended Level of Care: [place] Prior Approval Number:    Date Approved/Denied:   PASRR Number: [id number]  Discharge Plan: SNF    Current Diagnoses: Patient Active Problem List   Diagnosis Date Noted   UTI (urinary tract infection) 123XX123   Acute metabolic encephalopathy 123XX123   Chronic diastolic CHF (congestive heart failure) ([place]) 05/26/2022   Bilateral lower extremity edema 05/26/2022   Dementia ([place]) 05/26/2022   Essential hypertension 05/26/2022   Hyperlipidemia 05/26/2022   Closed nondisplaced type II dens fracture ([place]) 12/11/2021    Orientation RESPIRATION BLADDER Height & Weight     Self, Place  Normal Incontinent Weight: 142 lb 13.7 oz (64.8 kg) Height:  5\' 2"  (157.5 cm)  BEHAVIORAL SYMPTOMS/MOOD NEUROLOGICAL BOWEL NUTRITION STATUS      Continent Diet (See d/c summary)  AMBULATORY STATUS COMMUNICATION OF NEEDS Skin   Limited Assist Verbally Normal                       Personal Care Assistance Level of Assistance  Bathing, Feeding, Dressing Bathing Assistance: Maximum assistance Feeding assistance: Limited assistance Dressing Assistance: Limited assistance     Functional Limitations Info  Sight, Hearing, Speech Sight Info: Impaired Hearing Info: Adequate Speech Info: Adequate    SPECIAL CARE FACTORS FREQUENCY  OT (By licensed OT), PT (By licensed PT)     PT Frequency: 5x/ week OT Frequency: 5x/ week            Contractures Contractures Info: Not present     Additional Factors Info  Code Status, Allergies Code Status Info: Full Allergies Info: Penicillins   Sulfa Antibiotics   Diclofenac   Doxycycline   Morphine   Septra           Current Medications (05/28/2022):  This is the current hospital active medication list Current Facility-Administered Medications  Medication Dose Route Frequency Provider Last Rate Last Admin   0.9 %  sodium chloride infusion   Intravenous PRN Eugenie Filler, MD   Stopped at 05/27/22 [phone]   acetaminophen (TYLENOL) tablet 650 mg  650 mg Oral Q6H PRN Shela Leff, MD       Or   acetaminophen (TYLENOL) suppository 650 mg  650 mg Rectal Q6H PRN Shela Leff, MD       ALPRAZolam Duanne Moron) tablet 0.25 mg  0.25 mg Oral TID PRN Eugenie Filler, MD       atorvastatin (LIPITOR) tablet 10 mg  10 mg Oral Daily Eugenie Filler, MD   10 mg at 05/28/22 0904   aztreonam (AZACTAM) 1 g in sodium chloride 0.9 % 100 mL IVPB  1 g Intravenous Q8H Eugenie Filler, MD 200 mL/hr at 05/28/22 0558 1 g at 05/28/22 0558   donepezil (ARICEPT) tablet 10 mg  10 mg Oral QHS Eugenie Filler, MD   10 mg at 05/27/22 2138   enoxaparin (LOVENOX) injection 30 mg  30 mg Subcutaneous Q24H Theotis Burrow, [facility]  feeding supplement (ENSURE ENLIVE / ENSURE PLUS) liquid 237 mL  237 mL Oral BID BM Eugenie Filler, MD   237 mL at 05/28/22 0905   metoprolol succinate (TOPROL-XL) 24 hr tablet 25 mg  25 mg Oral Daily Eugenie Filler, MD   25 mg at 05/28/22 [id number]   multivitamin with minerals tablet 1 tablet  1 tablet Oral Daily Eugenie Filler, MD   1 tablet at 05/28/22 0904   pantoprazole (PROTONIX) EC tablet 40 mg  40 mg Oral Daily Eugenie Filler, MD   40 mg at 05/28/22 [id number]   QUEtiapine (SEROQUEL) tablet 12.5 mg  12.5 mg Oral QHS PRN Eugenie Filler, MD   12.5 mg at 05/26/22 2212   spironolactone (ALDACTONE) tablet 25 mg  25 mg Oral Daily Eugenie Filler, MD   25 mg at 05/28/22 0904   torsemide (DEMADEX) tablet  10 mg  10 mg Oral Daily Eugenie Filler, MD   10 mg at 05/28/22 [id number]     Discharge Medications: Please see discharge summary for a list of discharge medications.  Relevant Imaging Results:  Relevant Lab Results:   Additional Information SSN: [id number]; Ambulance person Booster 09/18/2021; 5'2"  142lbs  Milinda Antis, LCSWA

## 2022-05-29 DIAGNOSIS — E538 Deficiency of other specified B group vitamins: Secondary | ICD-10-CM | POA: Diagnosis present

## 2022-05-29 DIAGNOSIS — R600 Localized edema: Secondary | ICD-10-CM | POA: Diagnosis not present

## 2022-05-29 DIAGNOSIS — G9341 Metabolic encephalopathy: Secondary | ICD-10-CM | POA: Diagnosis not present

## 2022-05-29 DIAGNOSIS — N3000 Acute cystitis without hematuria: Secondary | ICD-10-CM | POA: Diagnosis not present

## 2022-05-29 DIAGNOSIS — N390 Urinary tract infection, site not specified: Secondary | ICD-10-CM | POA: Diagnosis not present

## 2022-05-29 LAB — BASIC METABOLIC PANEL
Anion gap: 10 (ref 5–15)
BUN: 30 mg/dL — ABNORMAL HIGH (ref 8–23)
CO2: 21 mmol/L — ABNORMAL LOW (ref 22–32)
Calcium: 8.8 mg/dL — ABNORMAL LOW (ref 8.9–10.3)
Chloride: 103 mmol/L (ref 98–111)
Creatinine, Ser: 0.93 mg/dL (ref 0.44–1.00)
GFR, Estimated: 57 mL/min — ABNORMAL LOW (ref >60)
Glucose, Bld: 98 mg/dL (ref 70–99)
Potassium: 4.3 mmol/L (ref 3.5–5.1)
Sodium: 134 mmol/L — ABNORMAL LOW (ref 135–145)

## 2022-05-29 LAB — AMMONIA: Ammonia: 90 umol/L — ABNORMAL HIGH (ref 9–35)

## 2022-05-29 LAB — FOLATE: Folate: 9.4 ng/mL (ref >5.9)

## 2022-05-29 LAB — VITAMIN B12: Vitamin B-12: 194 pg/mL (ref 180–914)

## 2022-05-29 LAB — TSH: TSH: 1.826 u[IU]/mL (ref 0.350–4.500)

## 2022-05-29 MED ORDER — CYANOCOBALAMIN 1000 MCG/ML IJ SOLN
1000.0000 ug | Freq: Every day | INTRAMUSCULAR | Status: DC
  Administered 2022-05-29 – 2022-05-30 (×2): 1000 ug via SUBCUTANEOUS
  Filled 2022-05-29 (×2): qty 1

## 2022-05-29 MED ORDER — LACTULOSE 10 GM/15ML PO SOLN
10.0000 g | Freq: Once | ORAL | Status: AC
  Administered 2022-05-29: 10 g via ORAL
  Filled 2022-05-29: qty 15

## 2022-05-29 MED ORDER — ENOXAPARIN SODIUM 40 MG/0.4ML IJ SOSY
40.0000 mg | PREFILLED_SYRINGE | INTRAMUSCULAR | Status: DC
  Administered 2022-05-29: 40 mg via SUBCUTANEOUS
  Filled 2022-05-29: qty 0.4

## 2022-05-29 NOTE — Progress Notes (Signed)
PROGRESS NOTE    Shari Scott  [id number] DOB: 10-07-1926 DOA: 05/25/2022 PCP: Default, Provider, MD    Chief Complaint  Patient presents with   Altered Mental Status    Brief Narrative:  Patient pleasant 88 year old lady history of dementia, hypertension, hyperlipidemia, chronic diastolic CHF, anxiety presented to the ED for evaluation of altered mental status.  It is noted the patient's son is admitted to the hospital and when she went to visit them she was noted to be altered and sent to the ED.  Work-up concerning for UTI.  Head CT negative.  Patient placed on empiric IV antibiotics while urine cultures pending.   Assessment & Plan:   Principal Problem:   UTI (urinary tract infection) Active Problems:   Acute metabolic encephalopathy   Chronic diastolic CHF (congestive heart failure) (HCC)   Bilateral lower extremity edema   Dementia (HCC)   Essential hypertension   Hyperlipidemia   Low vitamin B12 level  #1 probable UTI -Patient presented with altered mental status.  Urinalysis concerning for UTI. -Patient afebrile, normal white count. -Urine cultures with multiple species. -Status post 3 days IV aztreonam. -Supportive care.  2.  Acute metabolic encephalopathy -Felt likely secondary to UTI in the setting of dementia,?  Polypharmacy. -CT head negative. -Urinalysis concerning for UTI, urine cultures with multiple species.  -TSH within normal limits.  Ammonia level at 90.  Folate level at 9.4.  Vitamin B12 at 194.  -Patient likely close to baseline.  -Placed on vitamin B12 supplementation subcutaneously in house and transition to oral vitamin B12 levels on discharge.  -We will give a dose of lactulose x1.  -Status post 3 days of antibiotics.    3.  Chronic diastolic CHF -2D echo from July 2018 EF of 55 to 123456, mild diastolic dysfunction, mild aortic stenosis and mild MR. -Per admitting physician patient noted to have significant peripheral edema but BNP was  within normal limits.  Chest x-ray with no acute pulmonary edema. -Patient was placed on Lasix 40 mg IV daily and has been transition to home regimen torsemide. -Continue Lipitor, Toprol-XL, Aldactone.  4.  Bilateral lower extremity edema -2D echo from July 2018 with EF of 55 to 123456, mild diastolic dysfunction, mild aortic stenosis, mild MR. -Chest x-ray with no pulmonary edema noted. -Lower extremity Dopplers negative for DVT. -Patient placed on IV Lasix and has been transition back to home dose oral torsemide.   -Patient with no pitting edema.   -Continue home regimen Aldactone.   -Outpatient follow-up with PCP.    5.  Dementia -Stable. -Continue home regimen Aricept. -Seroquel nightly as needed agitation.  6.  Hyperlipidemia -Statin.  7.  Hypertension Continue Toprol-XL, Aldactone, torsemide.  8.  Low vitamin B12 levels -Vitamin B12 at 194. -Place on vitamin B12 1000 mcg subcutaneously daily x1 week, then weekly x1 month. -Can transition to oral vitamin B12 supplementation on discharge   DVT prophylaxis: Lovenox Code Status: Full Family Communication: Updated patient.  No family at bedside Disposition: Needing SNF.  Medically stable.  Status is: Inpatient Remains inpatient appropriate because: Unsafe disposition..   Consultants:  None  Procedures:  CT head 05/26/2022 Lower extremity Doppler 05/26/2022   Antimicrobials:  IV aztreonam 05/26/2022>>>> 05/28/2022   Subjective: Sitting up in chair.  Stating she needs help with her lunch.  No chest pain.  No shortness of breath.  No abdominal pain.  Asking how her son is doing who has been hospitalized.   Objective: Vitals:   05/28/22 1740  05/28/22 2056 05/29/22 0620 05/29/22 0951  BP: 139/63 (!) 148/65 134/72 (!) 150/73  Pulse: 78 90 85 87  Resp: 18 17 18 18   Temp: 98 F (36.7 C) 99 F (37.2 C) 98.4 F (36.9 C) 98.6 F (37 C)  TempSrc:  Oral Oral Oral  SpO2: 98% 96% 98% 94%  Weight:      Height:         Intake/Output Summary (Last 24 hours) at 05/29/2022 1251 Last data filed at 05/29/2022 1000 Gross per 24 hour  Intake 780 ml  Output 425 ml  Net 355 ml    Filed Weights   05/25/22 1715 05/26/22 1700  Weight: 59 kg 64.8 kg    Examination:  General exam: NAD Respiratory system: Lungs clear to auscultation bilaterally.  No wheezes, no crackles, no rhonchi.  Normal respiratory effort.  Cardiovascular system: Regular rate rhythm no murmurs rubs or gallops.  No JVD.  No lower extremity edema.  Gastrointestinal system: Abdomen is nondistended, soft and nontender. No organomegaly or masses felt. Normal bowel sounds heard. Central nervous system: Alert and oriented.  Moving extremities spontaneously.  No focal neurological deficits. Extremities: Symmetric 5 x 5 power. Skin: No rashes, lesions or ulcers Psychiatry: Judgement and insight appear fair.. Mood & affect appropriate.     Data Reviewed: I have personally reviewed following labs and imaging studies  CBC: Recent Labs  Lab 05/25/22 1838 05/27/22 0314 05/28/22 0332  WBC 6.4 7.1 7.8  NEUTROABS 3.4 3.4 4.0  HGB 14.2 14.6 13.9  HCT 45.1 43.4 41.9  MCV 91.7 89.1 88.8  PLT 282 259 261     Basic Metabolic Panel: Recent Labs  Lab 05/25/22 1838 05/27/22 0314 05/28/22 0332 05/29/22 0256  NA 134* 138 135 134*  K 4.0 3.8 4.2 4.3  CL 102 102 99 103  CO2 22 25 27  21*  GLUCOSE 98 102* 93 98  BUN 26* 23 24* 30*  CREATININE 0.94 1.01* 1.11* 0.93  CALCIUM 9.3 9.2 9.0 8.8*     GFR: Estimated Creatinine Clearance: 32.7 mL/min (by C-G formula based on SCr of 0.93 mg/dL).  Liver Function Tests: Recent Labs  Lab 05/25/22 1838  AST 17  ALT 14  ALKPHOS 130*  BILITOT 0.6  PROT 7.1  ALBUMIN 3.6     CBG: No results for input(s): "GLUCAP" in the last 168 hours.   Recent Results (from the past 240 hour(s))  Urine Culture     Status: Abnormal   Collection Time: 05/26/22  3:52 AM   Specimen: Urine, Clean Catch   Result Value Ref Range Status   Specimen Description URINE, CLEAN CATCH  Final   Special Requests   Final    NONE Performed at [facility], [address], [place], [place] [address]    Culture MULTIPLE SPECIES PRESENT, SUGGEST RECOLLECTION (A)  Final   Report Status 05/28/2022 FINAL  Final         Radiology Studies: No results found.      Scheduled Meds:  atorvastatin  10 mg Oral Daily   cyanocobalamin  1,000 mcg Subcutaneous Daily   donepezil  10 mg Oral QHS   enoxaparin (LOVENOX) injection  30 mg Subcutaneous Q24H   feeding supplement  237 mL Oral BID BM   metoprolol succinate  25 mg Oral Daily   multivitamin with minerals  1 tablet Oral Daily   pantoprazole  40 mg Oral Daily   spironolactone  25 mg Oral Daily   torsemide  10  mg Oral Daily   Continuous Infusions:  sodium chloride Stopped (05/27/22 0640)     LOS: 3 days    Time spent: 35 minutes    Irine Seal, MD [facility]   To contact the attending provider between 7A-7P or the covering provider during after hours 7P-7A, please log into the web site [email or web address] and access using universal [place] password for that web site. If you do not have the password, please call the hospital operator.  05/29/2022, 12:51 PM

## 2022-05-29 NOTE — Progress Notes (Signed)
  Mobility Specialist Criteria Algorithm Info.   05/29/22 1500  Mobility  Activity Ambulated with assistance in hallway;Ambulated with assistance to bathroom (in chair before and after ambulation)  Range of Motion/Exercises Active;All extremities  Level of Assistance Contact guard assist, steadying assist (Min A sit>stand)  Assistive Device Front wheel walker  Distance Ambulated (ft) 280 ft  Activity Response Tolerated well   Patient received in recliner A&O x2. Reoriented pt to time, place and situation. Agreeable to participate in mobility. Required min A sit>stand + cues for hand placement. Ambulated to bathroom then in hallway, min guard with very slow gait. Returned to room without complaint or incident. Pt is pleasantly confused and asked the same questions over constantly. Was left in recliner chair with all needs met, call bell in reach.   05/29/2022 4:27 PM  Shari Scott, [place], [place]  BBHQS:[phone] Office: [phone]

## 2022-05-29 NOTE — Progress Notes (Signed)
Per Dr. Janee Morn, ok to remain with PIV. Patient doesn't have IV medications ordered (scheduled or PRN) and PIV access isn't indicated. Primary RN notified and will place consult if IV access is needed due to agitation or IV medication initiation.   Brindy Higginbotham Loyola Mast, RN

## 2022-05-29 NOTE — TOC Progression Note (Signed)
Transition of Care [facility]) - Initial/Assessment Note    Patient Details  Name: Shari Scott MRN: [id number] Date of Birth: Apr 15, 1926  [facility] CM/SW Contact:    Ralene Bathe, LCSWA Phone Number: 05/29/2022, 11:35 AM  Clinical Narrative:                 CSW spoke with the patient's son Onalee Hua and presented bed offers.  The family choose [facility].    CSW contacted the facility and they can accept the patient.  Insurance auth initiated.    Expected Discharge Plan: Skilled Nursing Facility Barriers to Discharge: SNF Pending bed offer, Insurance Authorization, Continued Medical Work up   Patient Goals and CMS Choice Patient states their goals for this hospitalization and ongoing recovery are:: To go home with son CMS Medicare.gov Compare Post Acute Care list provided to:: Patient Choice offered to / list presented to : Patient  Expected Discharge Plan and Services Expected Discharge Plan: Skilled Nursing Facility       Living arrangements for the past 2 months: Single Family Home                                      Prior Living Arrangements/Services Living arrangements for the past 2 months: Single Family Home Lives with:: Adult Children Patient language and need for interpreter reviewed:: Yes Do you feel safe going back to the place where you live?: Yes      Need for Family Participation in Patient Care: Yes (Comment) Care giver support system in place?: No (comment)   Criminal Activity/Legal Involvement Pertinent to Current Situation/Hospitalization: No - Comment as needed  Activities of Daily Living Home Assistive Devices/Equipment: Other (Comment) (unknown, AMS) ADL Screening (condition at time of admission) Patient's cognitive ability adequate to safely complete daily activities?: No Is the patient deaf or have difficulty hearing?: No Does the patient have difficulty seeing, even when wearing glasses/contacts?: No Does the patient have  difficulty concentrating, remembering, or making decisions?: Yes Patient able to express need for assistance with ADLs?: No Does the patient have difficulty dressing or bathing?: Yes Independently performs ADLs?: No Communication: Independent Dressing (OT): Needs assistance Grooming: Needs assistance Feeding: Independent Bathing: Needs assistance Toileting: Needs assistance In/Out Bed: Needs assistance Walks in Home: Independent Does the patient have difficulty walking or climbing stairs?: No Weakness of Legs: None Weakness of Arms/Hands: None  Permission Sought/Granted Permission sought to share information with : Other (comment), Family Supports (CSW) Permission granted to share information with : Yes, Verbal Permission Granted     Permission granted to share info w AGENCY: SNFs        Emotional Assessment Appearance:: Appears stated age Attitude/Demeanor/Rapport: Complaining Affect (typically observed): Stable Orientation: : Oriented to Self, Oriented to Place Alcohol / Substance Use: Not Applicable Psych Involvement: No (comment)  Admission diagnosis:  Delirium [R41.0] UTI (urinary tract infection) [N39.0] Acute cystitis without hematuria [N30.00] Patient Active Problem List   Diagnosis Date Noted   Low vitamin B12 level 05/29/2022   UTI (urinary tract infection) 05/26/2022   Acute metabolic encephalopathy 05/26/2022   Chronic diastolic CHF (congestive heart failure) (HCC) 05/26/2022   Bilateral lower extremity edema 05/26/2022   Dementia (HCC) 05/26/2022   Essential hypertension 05/26/2022   Hyperlipidemia 05/26/2022   Closed nondisplaced type II dens fracture (HCC) 12/11/2021   PCP:  Default, Provider, MD Pharmacy:   [facility] [phone] [address] -  [place], [place] - [address] [place] [address] Phone: [phone] Fax: [phone]  [id number] - [place], [place] - [address] AT [facility] [address] [place] [address] Phone: [phone] Fax: [phone]     Social Determinants of Health (SDOH) Interventions    Readmission Risk Interventions     No data to display

## 2022-05-29 NOTE — Progress Notes (Signed)
Occupational Therapy Treatment Patient Details Name: Shari Scott MRN: [id number] DOB: 1926/10/11 Today's Date: 05/29/2022   History of present illness Shari Scott is a 88 y.o. presented to the ED for evaluation of altered mental status, UTI; Reportedly the patient's son is admitted to the hospital and when she went to visit him, it was noted that she was altered and was sent to the ED to be evaluated; female with medical history significant of dementia, hypertension, hyperlipidemia, chronic diastolic CHF, anxiety   OT comments  Pt continues to make steady progress with OT.  Currently, min guard assist with mod instructional cueing for use of the RW with completion of toileting tasks and toilet transfers.  Still with severe confusion, unable to recall information from seconds/mins prior.  Recommend continued acute care OT to address current deficits with transition to SNF for further rehab as pt will not have 24 hr supervision needed at home.    Recommendations for follow up therapy are one component of a multi-disciplinary discharge planning process, led by the attending physician.  Recommendations may be updated based on patient status, additional functional criteria and insurance authorization.    Follow Up Recommendations  Skilled nursing-short term rehab (<3 hours/day)    Assistance Recommended at Discharge Frequent or constant Supervision/Assistance  Patient can return home with the following  A little help with walking and/or transfers;Assistance with cooking/housework;Direct supervision/assist for financial management;Direct supervision/assist for medications management;Assist for transportation;Help with stairs or ramp for entrance;A little help with bathing/dressing/bathroom   Equipment Recommendations  Other (comment) (defer to next venue of care)       Precautions / Restrictions Precautions Precautions: Fall Precaution Comments: Urinary urgency/frequency Restrictions Weight  Bearing Restrictions: No       Mobility Bed Mobility                    Transfers     Transfers: Sit to/from Stand Sit to Stand: Min guard                 Balance Overall balance assessment: Needs assistance Sitting-balance support: No upper extremity supported, Feet supported Sitting balance-Leahy Scale: Fair     Standing balance support: Reliant on assistive device for balance, During functional activity Standing balance-Leahy Scale: Fair                             ADL either performed or assessed with clinical judgement   ADL Overall ADL's : Needs assistance/impaired Eating/Feeding: Set up;Sitting   Grooming: Min guard;Standing;Wash/dry face;Wash/dry Lawyer: Min guard;Ambulation;Rolling walker (2 wheels);BSC/3in1   Toileting- Clothing Manipulation and Hygiene: Minimal assistance;Sit to/from stand Toileting - Clothing Manipulation Details (indicate cue type and reason): min assist for gown while completing peri hygeine     Functional mobility during ADLs: Min guard;Rolling walker (2 wheels) General ADL Comments: Pt able to use the RW for mobility with max demonstrational cueing to stay inside of the RW while turning it and backing up to the surface.  Pt perseverating on wanting coffee.  Even when therapist tried to tell he he would see if she could have it after therapy, she continued to ask repeatedly.               Cognition Arousal/Alertness: Awake/alert Behavior During Therapy: WFL for tasks assessed/performed, Anxious Overall Cognitive Status: Impaired/Different from  baseline Area of Impairment: Memory, Awareness, Attention, Problem solving                   Current Attention Level: Focused Memory: Decreased short-term memory     Awareness: Intellectual (not aware of why she is in the hospital) Problem Solving: Slow processing General Comments: Pt unable to recall information  told seconds earlier by therapist and asking the same repeated questions throughout session.                   Pertinent Vitals/ Pain       Pain Assessment Pain Assessment: No/denies pain         Frequency  Min 2X/week        Progress Toward Goals  OT Goals(current goals can now be found in the care plan section)  Progress towards OT goals: Progressing toward goals  Acute Rehab OT Goals Patient Stated Goal: Pt did not state but requested coffee continuously OT Goal Formulation: With patient Potential to Achieve Goals: Good  Plan Discharge plan remains appropriate       AM-PAC OT "6 Clicks" Daily Activity     Outcome Measure   Help from another person eating meals?: None Help from another person taking care of personal grooming?: A Little Help from another person toileting, which includes using toliet, bedpan, or urinal?: A Little Help from another person bathing (including washing, rinsing, drying)?: A Little Help from another person to put on and taking off regular upper body clothing?: A Little Help from another person to put on and taking off regular lower body clothing?: A Little 6 Click Score: 19    End of Session Equipment Utilized During Treatment: Gait belt;Rolling walker (2 wheels)  OT Visit Diagnosis: Unsteadiness on feet (R26.81);Other abnormalities of gait and mobility (R26.89);Muscle weakness (generalized) (M62.81);Other symptoms and signs involving cognitive function   Activity Tolerance Patient tolerated treatment well   Patient Left with call bell/phone within reach;in chair;with chair alarm set   Nurse Communication Mobility status        Time: 1025-1100 OT Time Calculation (min): 35 min  Charges: OT General Charges $OT Visit: 1 Visit OT Treatments $Self Care/Home Management : 23-37 mins   Shari Scott OTR/L 05/29/2022, 11:10 AM

## 2022-05-29 NOTE — Care Management Important Message (Signed)
Important Message  Patient Details  Name: Shari Scott MRN: [id number] Date of Birth: 05-03-1926   Medicare Important Message Given:  Yes     [name] 05/29/2022, 3:27 PM

## 2022-05-30 DIAGNOSIS — I5032 Chronic diastolic (congestive) heart failure: Secondary | ICD-10-CM | POA: Diagnosis not present

## 2022-05-30 DIAGNOSIS — E538 Deficiency of other specified B group vitamins: Secondary | ICD-10-CM

## 2022-05-30 DIAGNOSIS — N390 Urinary tract infection, site not specified: Secondary | ICD-10-CM | POA: Diagnosis not present

## 2022-05-30 DIAGNOSIS — G9341 Metabolic encephalopathy: Secondary | ICD-10-CM | POA: Diagnosis not present

## 2022-05-30 DIAGNOSIS — R600 Localized edema: Secondary | ICD-10-CM | POA: Diagnosis not present

## 2022-05-30 LAB — BASIC METABOLIC PANEL
Anion gap: 11 (ref 5–15)
BUN: 34 mg/dL — ABNORMAL HIGH (ref 8–23)
CO2: 23 mmol/L (ref 22–32)
Calcium: 8.8 mg/dL — ABNORMAL LOW (ref 8.9–10.3)
Chloride: 102 mmol/L (ref 98–111)
Creatinine, Ser: 1.07 mg/dL — ABNORMAL HIGH (ref 0.44–1.00)
GFR, Estimated: 48 mL/min — ABNORMAL LOW (ref >60)
Glucose, Bld: 99 mg/dL (ref 70–99)
Potassium: 4.3 mmol/L (ref 3.5–5.1)
Sodium: 136 mmol/L (ref 135–145)

## 2022-05-30 LAB — AMMONIA: Ammonia: 32 umol/L (ref 9–35)

## 2022-05-30 MED ORDER — QUETIAPINE FUMARATE 25 MG PO TABS: 12.5000 mg | ORAL_TABLET | Freq: Every evening | ORAL | 0 refills | Status: AC | PRN

## 2022-05-30 MED ORDER — LACTULOSE 10 GM/15ML PO SOLN
10.0000 g | Freq: Once | ORAL | Status: AC
Start: 2022-05-30 — End: 2022-05-30
  Administered 2022-05-30: 10 g via ORAL
  Filled 2022-05-30: qty 15

## 2022-05-30 MED ORDER — ALPRAZOLAM 0.25 MG PO TABS: 0.2500 mg | ORAL_TABLET | Freq: Three times a day (TID) | ORAL | 0 refills | Status: AC | PRN

## 2022-05-30 MED ORDER — VITAMIN B-12 1000 MCG PO TABS: 1000.0000 ug | ORAL_TABLET | Freq: Every day | ORAL | Status: AC

## 2022-05-30 MED ORDER — ENOXAPARIN SODIUM 30 MG/0.3ML IJ SOSY: 30.0000 mg | PREFILLED_SYRINGE | INTRAMUSCULAR | Status: DC

## 2022-05-30 NOTE — TOC Progression Note (Signed)
Transition of Care [facility]) - Initial/Assessment Note    Patient Details  Name: Shari Scott MRN: [id number] Date of Birth: [birth year]  Transition [facility]) CM/SW Contact:    Ralene Bathe, LCSWA Phone Number: 05/30/2022, 9:07 AM  Clinical Narrative:                 Insurance authorization approved from 6/15-6/17.  Auth number [id number].  Care team and facility informed.  Expected Discharge Plan: Skilled Nursing Facility Barriers to Discharge: SNF Pending bed offer, Insurance Authorization, Continued Medical Work up   Patient Goals and CMS Choice Patient states their goals for this hospitalization and ongoing recovery are:: To go home with son CMS Medicare.gov Compare Post Acute Care list provided to:: Patient Choice offered to / list presented to : Patient  Expected Discharge Plan and Services Expected Discharge Plan: Skilled Nursing Facility       Living arrangements for the past 2 months: Single Family Home                                      Prior Living Arrangements/Services Living arrangements for the past 2 months: Single Family Home Lives with:: Adult Children Patient language and need for interpreter reviewed:: Yes Do you feel safe going back to the place where you live?: Yes      Need for Family Participation in Patient Care: Yes (Comment) Care giver support system in place?: No (comment)   Criminal Activity/Legal Involvement Pertinent to Current Situation/Hospitalization: No - Comment as needed  Activities of Daily Living Home Assistive Devices/Equipment: Other (Comment) (unknown, AMS) ADL Screening (condition at time of admission) Patient's cognitive ability adequate to safely complete daily activities?: No Is the patient deaf or have difficulty hearing?: No Does the patient have difficulty seeing, even when wearing glasses/contacts?: No Does the patient have difficulty concentrating, remembering, or making decisions?: Yes Patient able to  express need for assistance with ADLs?: No Does the patient have difficulty dressing or bathing?: Yes Independently performs ADLs?: No Communication: Independent Dressing (OT): Needs assistance Grooming: Needs assistance Feeding: Independent Bathing: Needs assistance Toileting: Needs assistance In/Out Bed: Needs assistance Walks in Home: Independent Does the patient have difficulty walking or climbing stairs?: No Weakness of Legs: None Weakness of Arms/Hands: None  Permission Sought/Granted Permission sought to share information with : Other (comment), Family Supports (CSW) Permission granted to share information with : Yes, Verbal Permission Granted     Permission granted to share info w AGENCY: SNFs        Emotional Assessment Appearance:: Appears stated age Attitude/Demeanor/Rapport: Complaining Affect (typically observed): Stable Orientation: : Oriented to Self, Oriented to Place Alcohol / Substance Use: Not Applicable Psych Involvement: No (comment)  Admission diagnosis:  Delirium [R41.0] UTI (urinary tract infection) [N39.0] Acute cystitis without hematuria [N30.00] Patient Active Problem List   Diagnosis Date Noted   Low vitamin B12 level 05/29/2022   UTI (urinary tract infection) 05/26/2022   Acute metabolic encephalopathy 05/26/2022   Chronic diastolic CHF (congestive heart failure) (HCC) 05/26/2022   Bilateral lower extremity edema 05/26/2022   Dementia (HCC) 05/26/2022   Essential hypertension 05/26/2022   Hyperlipidemia 05/26/2022   Closed nondisplaced type II dens fracture (HCC) 12/11/2021   PCP:  Default, Provider, MD Pharmacy:   [facility] [phone] [address] - [place], [place] - [address] [place] [address] Phone: [phone] Fax: [phone]  [id number] - [place], [place] - [address] AT [facility] [address] [place] [address] Phone: [phone] Fax:  [phone]     Social Determinants of Health (SDOH) Interventions    Readmission Risk Interventions     No data to display

## 2022-05-30 NOTE — TOC Transition Note (Signed)
Transition of Care [facility]) - CM/SW Discharge Note   Patient Details  Name: Shari Scott MRN: [id number] Date of Birth: [birth year]  Transition of Care [facility]) CM/SW Contact:  Ralene Bathe, LCSWA Phone Number: 05/30/2022, 2:48 PM   Clinical Narrative:    Patient will DC to: [facility] Anticipated DC date:  05/30/2022 Family notified:  Yes Transport by: Family   Per MD patient ready for DC to SNF. RN to call report prior to discharge [phone] room 410B. RN, patient, patient's family, and facility notified of DC. Discharge Summary and FL2 sent to facility. DC packet on chart. Patient will be transported by family.   CSW will sign off for now as social work intervention is no longer needed. Please consult [place] again if new needs arise.     Final next level of care: Skilled Nursing Facility Barriers to Discharge: SNF Pending bed offer, Insurance Authorization, Continued Medical Work up   Patient Goals and CMS Choice Patient states their goals for this hospitalization and ongoing recovery are:: To go home with son CMS Medicare.gov Compare Post Acute Care list provided to:: Patient Choice offered to / list presented to : Patient  Discharge Placement              Patient chooses bed at:  [facility]) Patient to be transferred to facility by: Family Name of family member notified: Jessilynn, [place] (Son)   [phone] Patient and family notified of of transfer: 05/30/22  Discharge Plan and Services                                     Social Determinants of Health (SDOH) Interventions     Readmission Risk Interventions     No data to display

## 2022-05-30 NOTE — Progress Notes (Signed)
Physical Therapy Treatment Patient Details Name: Shari Scott MRN: [id number] DOB: [birth year] Today's Date: 05/30/2022   History of Present Illness Shari Scott is a 88 y.o. presented to the ED for evaluation of altered mental status, UTI; Reportedly the patient's son is admitted to the hospital and when she went to visit him, it was noted that she was altered and was sent to the ED to be evaluated; female with medical history significant of dementia, hypertension, hyperlipidemia, chronic diastolic CHF, anxiety    PT Comments    Patient continues to be limited by cognitive deficits. Patient repeated self throughout session and unable to recall information provided seconds earlier. Overall requiring min guard for safety with use of RW for ambulation. Patient did take steps in bathroom with no AD and min guard but quickly reaches for objects to steady self. Continue to recommend SNF for ongoing Physical Therapy.       Recommendations for follow up therapy are one component of a multi-disciplinary discharge planning process, led by the attending physician.  Recommendations may be updated based on patient status, additional functional criteria and insurance authorization.  Follow Up Recommendations  Skilled nursing-short term rehab (<3 hours/day)     Assistance Recommended at Discharge Frequent or constant Supervision/Assistance  Patient can return home with the following Help with stairs or ramp for entrance;Direct supervision/assist for medications management;Direct supervision/assist for financial management;Assist for transportation;Assistance with cooking/housework;A lot of help with bathing/dressing/bathroom;A little help with walking and/or transfers   Equipment Recommendations  BSC/3in1;Rolling Amethyst Gainer (2 wheels)    Recommendations for Other Services       Precautions / Restrictions Precautions Precautions: Fall Precaution Comments: Urinary urgency/frequency Restrictions Weight  Bearing Restrictions: No     Mobility  Bed Mobility               General bed mobility comments: up in chair upon arrival    Transfers Overall transfer level: Needs assistance Equipment used: Rolling Aroura Vasudevan (2 wheels) Transfers: Sit to/from Stand Sit to Stand: Min guard           General transfer comment: min guard for safety. Cues for hand placement on RW    Ambulation/Gait Ambulation/Gait assistance: Min guard Gait Distance (Feet): 200 Feet Assistive device: Rolling Lajuanna Pompa (2 wheels) Gait Pattern/deviations: Step-through pattern, Decreased stride length, Trunk flexed Gait velocity: decreased Gait velocity interpretation: <1.8 ft/sec, indicate of risk for recurrent falls   General Gait Details: Cues for RW management as patient pushing RW too far out in front of her. Min guard for safety. Cues for navigating hallway and pathfinding back to room   Stairs             Wheelchair Mobility    Modified Rankin (Stroke Patients Only)       Balance Overall balance assessment: Needs assistance Sitting-balance support: No upper extremity supported, Feet supported Sitting balance-Leahy Scale: Fair     Standing balance support: No upper extremity supported Standing balance-Leahy Scale: Fair Standing balance comment: able to take steps towards toilet with no UE support with min guard                            Cognition Arousal/Alertness: Awake/alert Behavior During Therapy: WFL for tasks assessed/performed, Anxious Overall Cognitive Status: Impaired/Different from baseline Area of Impairment: Orientation, Memory, Attention, Problem solving, Awareness                 Orientation Level: Disoriented to, Time,  Situation, Place Current Attention Level: Focused Memory: Decreased short-term memory     Awareness: Intellectual Problem Solving: Slow processing General Comments: STM deficits. Unable to recall information seconds earlier and  repeating self throughout session. Difficult to reorient        Exercises      General Comments        Pertinent Vitals/Pain Pain Assessment Pain Assessment: No/denies pain    Home Living                          Prior Function            PT Goals (current goals can now be found in the care plan section) Acute Rehab PT Goals Patient Stated Goal: Wants to know when she "can go" PT Goal Formulation: With patient Time For Goal Achievement: 06/10/22 Potential to Achieve Goals: Good Progress towards PT goals: Progressing toward goals    Frequency    Min 2X/week      PT Plan Current plan remains appropriate    Co-evaluation              AM-PAC PT "6 Clicks" Mobility   Outcome Measure  Help needed turning from your back to your side while in a flat bed without using bedrails?: A Little Help needed moving from lying on your back to sitting on the side of a flat bed without using bedrails?: A Little Help needed moving to and from a bed to a chair (including a wheelchair)?: A Little Help needed standing up from a chair using your arms (e.g., wheelchair or bedside chair)?: A Little Help needed to walk in hospital room?: A Little Help needed climbing 3-5 steps with a railing? : A Lot 6 Click Score: 17    End of Session Equipment Utilized During Treatment: Gait belt Activity Tolerance: Patient tolerated treatment well Patient left: in chair;with call bell/phone within reach;with chair alarm set Nurse Communication: Mobility status PT Visit Diagnosis: Unsteadiness on feet (R26.81);Muscle weakness (generalized) (M62.81);Other symptoms and signs involving the nervous system (U38.453)     Time: 6468-0321 PT Time Calculation (min) (ACUTE ONLY): 29 min  Charges:  $Gait Training: 8-22 mins $Therapeutic Activity: 8-22 mins                     Mizraim Harmening A. Dan Humphreys PT, DPT Acute Rehabilitation Services Office [phone]    Viviann Spare 05/30/2022,  9:58 AM

## 2022-05-30 NOTE — Progress Notes (Signed)
Mobility Specialist Criteria Algorithm Info.   05/30/22 1200  Mobility  Activity Ambulated with assistance in hallway;Ambulated with assistance to bathroom (in chair before and after ambulation)  Range of Motion/Exercises Active;All extremities  Level of Assistance Contact guard assist, steadying assist  Assistive Device Front wheel walker  Distance Ambulated (ft) 200 ft  Activity Response Tolerated well   Patient received in recliner chair requesting assistance to restroom. Required min A + cues for hand placement on RW to stand. Ambulated short distance to bathroom then in hallway min guard with slow gait. Pt easily distracted throughout session, had to reorient to tasks. Returned to room without complaint or incident. Was left in recliner with all needs met, call bell in reach and chair alarm set.   05/30/2022 12:47 PM  Shari Scott, [place], [place]  KPTWS:[phone] Office: [phone]

## 2022-05-30 NOTE — Discharge Summary (Signed)
Physician Discharge Summary  Shari Scott PPJ:[id number] DOB: [birth year] DOA: 05/25/2022  PCP: Default, Provider, MD  Admit date: 05/25/2022 Discharge date: 05/30/2022  Time spent: 55 minutes  Recommendations for Outpatient Follow-up:  Follow-up with MD at skilled nursing facility.  Patient will need a basic metabolic profile done in 1 week to follow-up on electrolytes and renal function.   Discharge Diagnoses:  Principal Problem:   UTI (urinary tract infection) Active Problems:   Acute metabolic encephalopathy   Chronic diastolic CHF (congestive heart failure) (HCC)   Bilateral lower extremity edema   Dementia (HCC)   Essential hypertension   Hyperlipidemia   Low vitamin B12 level   Discharge Condition: Stable and improved  Diet recommendation: Heart healthy  Filed Weights   05/25/22 1715 05/26/22 1700  Weight: 59 kg 64.8 kg    History of present illness:  HPI per Dr. Isaias Sakai Shari Scott is a 88 y.o. female with medical history significant of dementia, hypertension, hyperlipidemia, chronic diastolic CHF, anxiety presented to the ED for evaluation of altered mental status.  Reportedly the patient's son is admitted to the hospital and when she went to visit him, it was noted that she was altered and was sent to the ED to be evaluated.  Afebrile.  Labs showing WBC 6.4, hemoglobin 14.2, platelet count 282k.  Sodium 134, potassium 4.0, chloride 102, bicarb 22, BUN 26, creatinine 0.9, glucose 98.  UA with negative nitrite, moderate amount of leukocytes, and microscopy showing 11-20 WBCs and rare bacteria.  BNP normal.  Chest x-ray showing no active cardiopulmonary disease.  CT head negative for acute finding. Patient was given Haldol and aztreonam.   Patient is confused and does not know why she is in the emergency room.  She is able to tell me her name and date of birth.  Not oriented to time.  She knows she is at a hospital in [place] but does not know the name of the  hospital.  States she lives with her son who is currently admitted to the hospital here.  She has no complaints.  Denies fevers, cough, shortness of breath, chest pain, nausea, vomiting, abdominal pain, diarrhea, or dysuria.  Reports chronic bilateral lower extremity edema.  Hospital Course:  #1 probable UTI -Patient presented with altered mental status.  Urinalysis concerning for UTI. -Patient afebrile, normal white count. -Urine cultures with multiple species. -Status post 3 days IV aztreonam.  2.  Acute metabolic encephalopathy -Felt likely secondary to UTI in the setting of dementia, -CT head negative. -Urinalysis concerning for UTI, urine cultures with multiple species.  -TSH within normal limits.  Ammonia level at 90.  Folate level at 9.4.  Vitamin B12 at 194.  -Patient received 3-day course of antibiotics during the hospitalization. -Patient placed on vitamin B12 supplementation. -Patient received 2 doses of lactulose during the hospitalization and ammonia level went from 92 32 by day of discharge. -Patient improved clinically and was close to baseline by day of discharge. -Patient was discharged in stable and improved condition. -Outpatient follow-up.  3.  Chronic diastolic CHF -2D echo from July 2018 EF of 55 to 60%, mild diastolic dysfunction, mild aortic stenosis and mild MR. -Per admitting physician patient noted to have significant peripheral edema but BNP was within normal limits.  Chest x-ray with no acute pulmonary edema. -Patient was placed on Lasix 40 mg IV daily and transitioned to home regimen torsemide. -Patient maintained on home regimen Lipitor, Toprol-XL, Aldactone.  4.  Bilateral lower extremity edema -  2D echo from July 2018 with EF of 55 to 60%, mild diastolic dysfunction, mild aortic stenosis, mild MR. -Chest x-ray with no pulmonary edema noted. -Lower extremity Dopplers negative for DVT. -Patient placed on IV Lasix and transitioned back to home dose oral  torsemide.   -Patient with no pitting edema.   -Patient placed back on home regimen Aldactone. -Outpatient follow-up with PCP.  5.  Dementia -Stable. -Patient placed back on home regimen Aricept. -Seroquel nightly as needed agitation.  6.  Hyperlipidemia -Patient maintained on statin.  7.  Hypertension Patient maintained on home regimen Toprol-XL, Aldactone, torsemide.  8.  Low vitamin B12 levels -Vitamin B12 at 194. -Patient placed on vitamin B12 1000 mcg subcutaneously daily during the hospitalization and will be discharged on oral vitamin B12 supplementation on discharge.   -Outpatient follow-up.        Procedures: CT head 05/26/2022 Lower extremity Doppler 05/26/2022  Consultations: None  Discharge Exam: Vitals:   05/30/22 0440 05/30/22 0748  BP: (!) 127/48 (!) 156/67  Pulse: 83 88  Resp: 19 16  Temp: 98.3 F (36.8 C) 97.6 F (36.4 C)  SpO2: 95% 97%    General: NAD Cardiovascular: Regular rate rhythm no murmurs rubs or gallops.  No JVD.  No lower extremity edema. Respiratory: Clear to auscultation bilaterally.  No wheezes, no crackles, no rhonchi.  Fair air movement.  Speaking in full sentences.  Discharge Instructions   Discharge Instructions     Diet - low sodium heart healthy   Complete by: As directed    Increase activity slowly   Complete by: As directed    No wound care   Complete by: As directed       Allergies as of 05/30/2022       Reactions   Penicillins Other (See Comments)   Other Reaction: Allergy   Sulfa Antibiotics Other (See Comments)   Other Reaction: Allergy   Diclofenac    Doxycycline    Morphine Other (See Comments)   Septra [sulfamethoxazole-trimethoprim]         Medication List     TAKE these medications    ALPRAZolam 0.25 MG tablet Commonly known as: XANAX Take 1 tablet (0.25 mg total) by mouth 3 (three) times daily as needed for anxiety.   atorvastatin 10 MG tablet Commonly known as: LIPITOR Take 10 mg by  mouth daily.   CertaVite Senior Tabs Take 1 tablet by mouth daily.   donepezil 10 MG tablet Commonly known as: ARICEPT Take 10 mg by mouth at bedtime.   metoprolol succinate 25 MG 24 hr tablet Commonly known as: TOPROL-XL Take 25 mg by mouth daily.   pantoprazole 40 MG tablet Commonly known as: PROTONIX Take 40 mg by mouth daily.   QUEtiapine 25 MG tablet Commonly known as: SEROQUEL Take 0.5 tablets (12.5 mg total) by mouth at bedtime as needed (agitation).   spironolactone 25 MG tablet Commonly known as: ALDACTONE Take 25 mg by mouth daily.   torsemide 10 MG tablet Commonly known as: DEMADEX Take 10 mg by mouth daily.   vitamin B-12 1000 MCG tablet Commonly known as: CYANOCOBALAMIN Take 1 tablet (1,000 mcg total) by mouth daily.       Allergies  Allergen Reactions   Penicillins Other (See Comments)    Other Reaction: Allergy   Sulfa Antibiotics Other (See Comments)    Other Reaction: Allergy   Diclofenac    Doxycycline    Morphine Other (See Comments)   Septra [Sulfamethoxazole-Trimethoprim]  Follow-up Information     MD AT SNF Follow up.                   The results of significant diagnostics from this hospitalization (including imaging, microbiology, ancillary and laboratory) are listed below for reference.    Significant Diagnostic Studies: VAS [place] LOWER EXTREMITY VENOUS (DVT)  Result Date: 05/27/2022  Lower Venous DVT Study Patient Name:  VALARI TAYLOR  Date of Exam:   05/26/2022 Medical Rec #: [id number]       Accession #:    [id number] Date of Birth: 10-29-1926        Patient Gender: F Patient Age:   11 years Exam Location:  [facility] Procedure:      VAS [place] LOWER EXTREMITY VENOUS (DVT) Referring Phys: Ulyess Blossom RATHORE --------------------------------------------------------------------------------  Indications: Swelling.  Risk Factors: UTI. Limitations: Patient refused compression maneuver secondary to pain. Comparison Study: Prior  negative left LEV done 06/06/2015 Performing Technologist: Sherren Kerns RVS  Examination Guidelines: A complete evaluation includes B-mode imaging, spectral Doppler, color Doppler, and power Doppler as needed of all accessible portions of each vessel. Bilateral testing is considered an integral part of a complete examination. Limited examinations for reoccurring indications may be performed as noted. The reflux portion of the exam is performed with the patient in reverse Trendelenburg.  +---------+---------------+---------+-----------+----------+-------------------+ RIGHT    CompressibilityPhasicitySpontaneityPropertiesThrombus Aging      +---------+---------------+---------+-----------+----------+-------------------+ CFV      Full           Yes      No                                       +---------+---------------+---------+-----------+----------+-------------------+ FV Prox                 Yes      No                   patent by color and                                                       Doppler             +---------+---------------+---------+-----------+----------+-------------------+ FV Mid                  Yes      No                   patent by color and                                                       Doppler             +---------+---------------+---------+-----------+----------+-------------------+ FV Distal               Yes      No                   patent by color and  Doppler             +---------+---------------+---------+-----------+----------+-------------------+ PFV                     Yes      Yes                  patent by color and                                                       Doppler             +---------+---------------+---------+-----------+----------+-------------------+ POP                     Yes      No                   patent by color  and                                                       Doppler             +---------+---------------+---------+-----------+----------+-------------------+ PTV      Full                                                             +---------+---------------+---------+-----------+----------+-------------------+ PERO     Full                                                             +---------+---------------+---------+-----------+----------+-------------------+   +---------+---------------+---------+-----------+----------+-------------------+ LEFT     CompressibilityPhasicitySpontaneityPropertiesThrombus Aging      +---------+---------------+---------+-----------+----------+-------------------+ CFV                     Yes      Yes                  patent by color and                                                       Doppler             +---------+---------------+---------+-----------+----------+-------------------+ FV Prox                 Yes      Yes                  patent by color and  Doppler             +---------+---------------+---------+-----------+----------+-------------------+ FV Mid                  Yes      Yes                  patent by color and                                                       Doppler             +---------+---------------+---------+-----------+----------+-------------------+ FV Distal               Yes      No                   patent by color and                                                       Doppler             +---------+---------------+---------+-----------+----------+-------------------+ PFV                     Yes      Yes                  patent by color and                                                       Doppler              +---------+---------------+---------+-----------+----------+-------------------+ POP                     Yes      Yes                  patent by color and                                                       Doppler             +---------+---------------+---------+-----------+----------+-------------------+ PTV      Full                                                             +---------+---------------+---------+-----------+----------+-------------------+ PERO     Full                                                             +---------+---------------+---------+-----------+----------+-------------------+  Summary: RIGHT: - There is no evidence of deep vein thrombosis in the lower extremity. However, portions of this examination were limited- see technologist comments above.  LEFT: - There is no evidence of deep vein thrombosis in the lower extremity. However, portions of this examination were limited- see technologist comments above.  *See table(s) above for measurements and observations. Electronically signed by Lemar Livings MD on 05/27/2022 at 6:03:09 PM.    Final    CT Head Wo Contrast  Result Date: 05/26/2022 CLINICAL DATA:  Facial trauma, penetrating EXAM: CT HEAD WITHOUT CONTRAST TECHNIQUE: Contiguous axial images were obtained from the base of the skull through the vertex without intravenous contrast. RADIATION DOSE REDUCTION: This exam was performed according to the departmental dose-optimization program which includes automated exposure control, adjustment of the mA and/or kV according to patient size and/or use of iterative reconstruction technique. COMPARISON:  12/02/2021 FINDINGS: Brain: There is atrophy and chronic small vessel disease changes. No acute intracranial abnormality. Specifically, no hemorrhage, hydrocephalus, mass lesion, acute infarction, or significant intracranial injury. Vascular: No hyperdense vessel or unexpected calcification. Skull: No acute  calvarial abnormality. Sinuses/Orbits: No acute findings Other: None IMPRESSION: Atrophy, chronic microvascular disease. No acute intracranial abnormality. Electronically Signed   By: Charlett Nose M.D.   On: 05/26/2022 03:12   DG Chest 2 View  Result Date: 05/26/2022 CLINICAL DATA:  Altered mental status. EXAM: CHEST - 2 VIEW COMPARISON:  Chest radiograph dated 12/03/2021. FINDINGS: Left lung base linear atelectasis/scarring. No focal consolidation, pleural effusion, or pneumothorax. The cardiac silhouette is within normal limits. Atherosclerotic calcification of the aorta. Median sternotomy wires and CABG vascular clips. Degenerative changes of the spine. No acute osseous pathology. IMPRESSION: No active cardiopulmonary disease. Electronically Signed   By: Elgie Collard M.D.   On: 05/26/2022 00:50    Microbiology: Recent Results (from the past 240 hour(s))  Urine Culture     Status: Abnormal   Collection Time: 05/26/22  3:52 AM   Specimen: Urine, Clean Catch  Result Value Ref Range Status   Specimen Description URINE, CLEAN CATCH  Final   Special Requests   Final    NONE Performed at [facility], [address] [place], [place] [address]    Culture MULTIPLE SPECIES PRESENT, SUGGEST RECOLLECTION (A)  Final   Report Status 05/28/2022 FINAL  Final     Labs: Basic Metabolic Panel: Recent Labs  Lab 05/25/22 1838 05/27/22 0314 05/28/22 0332 05/29/22 0256 05/30/22 0147  NA 134* 138 135 134* 136  K 4.0 3.8 4.2 4.3 4.3  CL 102 102 99 103 102  CO2 22 25 27  21* 23  GLUCOSE 98 102* 93 98 99  BUN 26* 23 24* 30* 34*  CREATININE 0.94 1.01* 1.11* 0.93 1.07*  CALCIUM 9.3 9.2 9.0 8.8* 8.8*   Liver Function Tests: Recent Labs  Lab 05/25/22 1838  AST 17  ALT 14  ALKPHOS 130*  BILITOT 0.6  PROT 7.1  ALBUMIN 3.6   No results for input(s): "LIPASE", "AMYLASE" in the last 168 hours. Recent Labs  Lab 05/29/22 0256 05/30/22 0147  AMMONIA 90* 32   CBC: Recent Labs  Lab  05/25/22 1838 05/27/22 0314 05/28/22 0332  WBC 6.4 7.1 7.8  NEUTROABS 3.4 3.4 4.0  HGB 14.2 14.6 13.9  HCT 45.1 43.4 41.9  MCV 91.7 89.1 88.8  PLT 282 259 261   Cardiac Enzymes: No results for input(s): "CKTOTAL", "CKMB", "CKMBINDEX", "TROPONINI" in the last 168 hours. BNP: BNP (last 3 results) Recent  Labs    05/25/22 1838 05/27/22 0314  BNP 90.8 110.7*    ProBNP (last 3 results) No results for input(s): "PROBNP" in the last 8760 hours.  CBG: No results for input(s): "GLUCAP" in the last 168 hours.     Signed:  Ramiro Harvest MD.  [facility] 05/30/2022, 11:43 AM

## 2023-04-01 ENCOUNTER — Encounter: Payer: Self-pay | Admitting: *Deleted

## 2024-12-16 DEATH — deceased
